# Patient Record
Sex: Male | Born: 1990
Health system: Southern US, Community
[De-identification: ages and names within clinical notes are randomized; demographics above are authoritative.]

## PROBLEM LIST (undated history)

## (undated) DIAGNOSIS — J40 Bronchitis, not specified as acute or chronic: Secondary | ICD-10-CM

## (undated) HISTORY — PX: OTHER SURGICAL HISTORY: SHX169

---
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
## 2011-04-22 LAB — DIFFERENTIAL
Basophils Absolute: 0
Eosinophils Absolute: 0.1
Lymphocytes Relative: 26
Lymphs Abs: 2.8
Neutro Abs: 6.7

## 2011-04-22 LAB — URINALYSIS, ROUTINE W REFLEX MICROSCOPIC
Bilirubin Urine: NEGATIVE
Hgb urine dipstick: NEGATIVE
Protein, ur: NEGATIVE
Urobilinogen, UA: 0.2

## 2011-04-22 LAB — POCT I-STAT, CHEM 8
Calcium, Ion: 1.17
Glucose, Bld: 75
HCT: 46
Hemoglobin: 15.6
TCO2: 28

## 2011-04-22 LAB — CBC
Hemoglobin: 14.2
MCHC: 32.8
Platelets: ADEQUATE
RDW: 14.8

## 2011-04-22 LAB — RAPID URINE DRUG SCREEN, HOSP PERFORMED
Amphetamines: NOT DETECTED
Barbiturates: NOT DETECTED
Benzodiazepines: NOT DETECTED
Opiates: NOT DETECTED

## 2011-10-13 ENCOUNTER — Telehealth (HOSPITAL_COMMUNITY): Payer: Self-pay | Admitting: *Deleted

## 2011-10-13 NOTE — ED Notes (Signed)
1300 Vicki from the [facility] called on VM at the clinical desk. Charlotte Sanes took the information off the phone and gave it to me. I could not find any recent visits for pt. Last visit in 11/ 15/2010. I did not know if she needs lab results from that visit.  I called Chip Boer back but they could not locate her. I left a message for her to call me back.Vassie Moselle 10/13/2011

## 2011-10-13 NOTE — ED Notes (Signed)
Fax received from Midge Aver at the [facility] requesting pt.'s chart and labs from most recent visit with a medical release form signed by the pt.  I printed both and faxed them to her @ [phone] and confirmation received. I called her and left a message on her VM that I had faxed the information she requested and to call me if any further questions. Vassie Moselle 10/13/2011

## 2011-10-27 ENCOUNTER — Encounter (HOSPITAL_COMMUNITY): Payer: Self-pay | Admitting: *Deleted

## 2011-10-27 ENCOUNTER — Emergency Department (HOSPITAL_COMMUNITY)
Admission: EM | Admit: 2011-10-27 | Discharge: 2011-10-27 | Disposition: A | Discharge status: Home / Self Care | Payer: Self-pay | Attending: Emergency Medicine | Admitting: Emergency Medicine

## 2011-10-27 DIAGNOSIS — K029 Dental caries, unspecified: Secondary | ICD-10-CM | POA: Insufficient documentation

## 2011-10-27 DIAGNOSIS — K0889 Other specified disorders of teeth and supporting structures: Secondary | ICD-10-CM

## 2011-10-27 DIAGNOSIS — J45909 Unspecified asthma, uncomplicated: Secondary | ICD-10-CM | POA: Insufficient documentation

## 2011-10-27 DIAGNOSIS — F17200 Nicotine dependence, unspecified, uncomplicated: Secondary | ICD-10-CM | POA: Insufficient documentation

## 2011-10-27 MED ORDER — HYDROCODONE-ACETAMINOPHEN 5-325 MG PO TABS: 1.0000 | ORAL_TABLET | Freq: Four times a day (QID) | ORAL | Status: AC | PRN

## 2011-10-27 MED ORDER — PENICILLIN V POTASSIUM 500 MG PO TABS: 500.0000 mg | ORAL_TABLET | Freq: Four times a day (QID) | ORAL | Status: AC

## 2011-10-27 MED ORDER — IBUPROFEN 800 MG PO TABS: 800.0000 mg | ORAL_TABLET | Freq: Three times a day (TID) | ORAL | Status: AC | PRN

## 2011-10-27 NOTE — Discharge Instructions (Signed)
Return here as needed. Follow up with the dentist provided. °

## 2011-10-27 NOTE — ED Notes (Signed)
Patient complains of pain in the top right side for 2 mths or more

## 2011-10-27 NOTE — ED Provider Notes (Signed)
History     CSN: [id number]  Arrival date & time 10/27/11  0919   First MD Initiated Contact with Patient 10/27/11 [phone]      Chief Complaint  Patient presents with  . Dental Pain    HPI Patient presents to the emergency department with right upper third molar pain.  He states that 2 months ago the tooth cracked and broken off.  He denies fever, nausea/vomiting, difficulty breathing or swallowing.  He also denies swelling under his tongue or neck. Past Medical History  Diagnosis Date  . Asthma     History reviewed. No pertinent past surgical history.  No family history on file.  History  Substance Use Topics  . Smoking status: Current Everyday Smoker  . Smokeless tobacco: Not on file  . Alcohol Use: Yes      Review of Systems All pertinent positives and negatives reviewed in the history of present illness  Allergies  Review of patient's allergies indicates no known allergies.  Home Medications   Current Outpatient Rx  Name Route Sig Dispense Refill  . ACETAMINOPHEN 500 MG PO TABS Oral Take 1,000 mg by mouth every 6 (six) hours as needed. For pain      BP 114/62  Pulse 62  Temp(Src) 98.5 F (36.9 C) (Oral)  Resp 16  Ht 5\' 7"  (1.702 m)  Wt 155 lb (70.308 kg)  BMI 24.28 kg/m2  SpO2 100%  Physical Exam  Constitutional: He appears well-developed and well-nourished. No distress.  HENT:  Head: Normocephalic and atraumatic. No trismus in the jaw.  Mouth/Throat: Uvula is midline, oropharynx is clear and moist and mucous membranes are normal. Abnormal dentition. Dental caries present. No uvula swelling.    Eyes: Pupils are equal, round, and reactive to light.  Cardiovascular: Normal rate, regular rhythm and normal heart sounds.  Exam reveals no gallop and no friction rub.   No murmur heard. Pulmonary/Chest: Effort normal and breath sounds normal. No respiratory distress. He has no wheezes.    ED Course  Procedures (including critical care time)  Patient  will be referred to oral surgery for further care of this broken tooth.  He is to return here as needed.  No signs of abscess at this time.   MDM          Carlyle Dolly, PA-C 10/27/11 1046

## 2011-10-27 NOTE — ED Notes (Signed)
Pt presents to department for evaluation of R upper molar pain. Ongoing for several months, no relief from OTC medications. 7/10 constant pain at the time. No facial swelling noted. Unable to see dentist at the time. He is alert and oriented x4. No signs of distress noted.

## 2011-10-29 NOTE — ED Provider Notes (Signed)
Medical screening examination/treatment/procedure(s) were performed by non-physician practitioner and as supervising physician I was immediately available for consultation/collaboration.   Suzi Roots, MD 10/29/11 [phone]

## 2012-05-09 ENCOUNTER — Emergency Department (HOSPITAL_COMMUNITY)
Admission: EM | Admit: 2012-05-09 | Discharge: 2012-05-09 | Disposition: A | Discharge status: Home / Self Care | Payer: No Typology Code available for payment source | Attending: Emergency Medicine | Admitting: Emergency Medicine

## 2012-05-09 ENCOUNTER — Emergency Department (HOSPITAL_COMMUNITY): Payer: No Typology Code available for payment source

## 2012-05-09 ENCOUNTER — Encounter (HOSPITAL_COMMUNITY): Payer: Self-pay | Admitting: Emergency Medicine

## 2012-05-09 DIAGNOSIS — F17200 Nicotine dependence, unspecified, uncomplicated: Secondary | ICD-10-CM | POA: Insufficient documentation

## 2012-05-09 DIAGNOSIS — IMO0002 Reserved for concepts with insufficient information to code with codable children: Secondary | ICD-10-CM | POA: Insufficient documentation

## 2012-05-09 DIAGNOSIS — Y9389 Activity, other specified: Secondary | ICD-10-CM | POA: Insufficient documentation

## 2012-05-09 DIAGNOSIS — J45909 Unspecified asthma, uncomplicated: Secondary | ICD-10-CM | POA: Insufficient documentation

## 2012-05-09 DIAGNOSIS — S7000XA Contusion of unspecified hip, initial encounter: Secondary | ICD-10-CM | POA: Insufficient documentation

## 2012-05-09 DIAGNOSIS — T148XXA Other injury of unspecified body region, initial encounter: Secondary | ICD-10-CM

## 2012-05-09 MED ORDER — OXYCODONE-ACETAMINOPHEN 5-325 MG PO TABS
2.0000 | ORAL_TABLET | Freq: Once | ORAL | Status: AC
  Administered 2012-05-09: 2 via ORAL
  Filled 2012-05-09: qty 2

## 2012-05-09 MED ORDER — CYCLOBENZAPRINE HCL 10 MG PO TABS: 10.0000 mg | ORAL_TABLET | Freq: Three times a day (TID) | ORAL | Status: DC | PRN

## 2012-05-09 MED ORDER — HYDROCODONE-ACETAMINOPHEN 5-325 MG PO TABS: 1.0000 | ORAL_TABLET | ORAL | Status: DC | PRN
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
## 2012-05-09 NOTE — ED Notes (Signed)
Patient transported to X-ray 
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
## 2012-05-09 NOTE — ED Provider Notes (Signed)
Medical screening examination/treatment/procedure(s) were performed by non-physician practitioner and as supervising physician I was immediately available for consultation/collaboration.  Olivia Mackie, MD 05/09/12 [phone]
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
## 2012-05-30 ENCOUNTER — Observation Stay (HOSPITAL_COMMUNITY)
Admission: EM | Admit: 2012-05-30 | Discharge: 2012-05-31 | Discharge status: Against Medical Advice | Payer: Self-pay | Attending: Emergency Medicine | Admitting: Emergency Medicine

## 2012-05-30 ENCOUNTER — Encounter (HOSPITAL_COMMUNITY): Payer: Self-pay | Admitting: *Deleted

## 2012-05-30 DIAGNOSIS — R55 Syncope and collapse: Principal | ICD-10-CM | POA: Insufficient documentation

## 2012-05-30 DIAGNOSIS — R011 Cardiac murmur, unspecified: Secondary | ICD-10-CM | POA: Insufficient documentation

## 2012-05-30 DIAGNOSIS — R0789 Other chest pain: Secondary | ICD-10-CM | POA: Insufficient documentation

## 2012-05-30 DIAGNOSIS — I10 Essential (primary) hypertension: Secondary | ICD-10-CM | POA: Insufficient documentation

## 2012-05-30 DIAGNOSIS — I959 Hypotension, unspecified: Secondary | ICD-10-CM | POA: Insufficient documentation

## 2012-05-30 HISTORY — DX: Bronchitis, not specified as acute or chronic: J40

## 2012-05-30 LAB — CBC WITH DIFFERENTIAL/PLATELET
Basophils Relative: 0 % (ref 0–1)
Eosinophils Absolute: 0.1 10*3/uL (ref 0.0–0.7)
Hemoglobin: 12.9 g/dL — ABNORMAL LOW (ref 13.0–17.0)
MCH: 30.4 pg (ref 26.0–34.0)
MCHC: 34.5 g/dL (ref 30.0–36.0)
Monocytes Relative: 18 % — ABNORMAL HIGH (ref 3–12)
Neutrophils Relative %: 71 % (ref 43–77)
Platelets: 195 10*3/uL (ref 150–400)

## 2012-05-30 LAB — RAPID URINE DRUG SCREEN, HOSP PERFORMED: Benzodiazepines: NOT DETECTED

## 2012-05-30 LAB — BASIC METABOLIC PANEL
BUN: 9 mg/dL (ref 6–23)
Calcium: 8.2 mg/dL — ABNORMAL LOW (ref 8.4–10.5)
Creatinine, Ser: 1.03 mg/dL (ref 0.50–1.35)
GFR calc Af Amer: >90 mL/min (ref >90)
GFR calc non Af Amer: >90 mL/min (ref >90)
Potassium: 3.6 mEq/L (ref 3.5–5.1)

## 2012-05-30 LAB — URINALYSIS, ROUTINE W REFLEX MICROSCOPIC
Bilirubin Urine: NEGATIVE
Nitrite: NEGATIVE
Urobilinogen, UA: 0.2 mg/dL (ref 0.0–1.0)

## 2012-05-30 MED ORDER — SODIUM CHLORIDE 0.9 % IV BOLUS (SEPSIS): 1000.0000 mL | Freq: Once | INTRAVENOUS | Status: DC

## 2012-05-30 MED ORDER — SODIUM CHLORIDE 0.9 % IV BOLUS (SEPSIS)
1000.0000 mL | Freq: Once | INTRAVENOUS | Status: AC
  Administered 2012-05-30: 1000 mL via INTRAVENOUS

## 2012-05-30 NOTE — ED Notes (Signed)
EMS called to home.  Found patient laying on couch BP was 92 systolic on EMS arrival. Patient complaining of dizziness and lightheadedness. Denies pain and nausea.
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
## 2012-05-30 NOTE — ED Notes (Signed)
Pt is aware of the need for urine sample. There is a urinal at bedside.
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
## 2012-05-31 NOTE — ED Notes (Signed)
Patient refusing lab work. RN made aware.

## 2012-05-31 NOTE — ED Notes (Signed)
Pt out in hallway requesting IV to be removed; states "I am not staying"; MD aware.

## 2012-05-31 NOTE — ED Notes (Signed)
Pt requesting to leave, does not want to stay to be admitted; [facility] in to see pt to discuss admission.

## 2012-05-31 NOTE — ED Notes (Signed)
RN in to remove IV and talk to pt about AMA; RN advised pt that if he left it would be AMA, RN advised that his BP has been low and they are unsure why and that if he left and his BP continued to drop he could suffer from numerous things including heart attack, stroke, cardiac arrest and death. PT verbalizes understanding and states " I am still leaving". [facility] notified. Upon returning to room pt had left, refusing to sign his AMA paperwork.

## 2012-08-24 ENCOUNTER — Emergency Department (HOSPITAL_COMMUNITY)
Admission: EM | Admit: 2012-08-24 | Discharge: 2012-08-24 | Disposition: A | Discharge status: Home / Self Care | Payer: Self-pay | Attending: Emergency Medicine | Admitting: Emergency Medicine

## 2012-08-24 ENCOUNTER — Encounter (HOSPITAL_COMMUNITY): Payer: Self-pay | Admitting: Emergency Medicine

## 2012-08-24 DIAGNOSIS — F17200 Nicotine dependence, unspecified, uncomplicated: Secondary | ICD-10-CM | POA: Insufficient documentation

## 2012-08-24 DIAGNOSIS — J45909 Unspecified asthma, uncomplicated: Secondary | ICD-10-CM | POA: Insufficient documentation

## 2012-08-24 DIAGNOSIS — Z8709 Personal history of other diseases of the respiratory system: Secondary | ICD-10-CM | POA: Insufficient documentation

## 2012-08-24 DIAGNOSIS — L299 Pruritus, unspecified: Secondary | ICD-10-CM | POA: Insufficient documentation

## 2012-08-24 DIAGNOSIS — Z79899 Other long term (current) drug therapy: Secondary | ICD-10-CM | POA: Insufficient documentation

## 2012-08-24 DIAGNOSIS — B86 Scabies: Secondary | ICD-10-CM | POA: Insufficient documentation

## 2012-08-24 MED ORDER — PERMETHRIN 5 % EX CREA: TOPICAL_CREAM | CUTANEOUS | Status: DC

## 2012-08-24 MED ORDER — HYDROXYZINE HCL 10 MG PO TABS: 10.0000 mg | ORAL_TABLET | Freq: Four times a day (QID) | ORAL | Status: DC | PRN
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
## 2012-08-24 NOTE — ED Notes (Signed)
Pt reports itching at night X2-3 weeks, no other complaints, NAD

## 2012-08-24 NOTE — ED Provider Notes (Signed)
Medical screening examination/treatment/procedure(s) were performed by non-physician practitioner and as supervising physician I was immediately available for consultation/collaboration.   Carleene Cooper III, MD 08/24/12 2014

## 2012-11-27 ENCOUNTER — Encounter (HOSPITAL_COMMUNITY): Payer: Self-pay | Admitting: *Deleted

## 2012-11-27 ENCOUNTER — Emergency Department (HOSPITAL_COMMUNITY): Payer: Self-pay

## 2012-11-27 ENCOUNTER — Emergency Department (HOSPITAL_COMMUNITY)
Admission: EM | Admit: 2012-11-27 | Discharge: 2012-11-27 | Discharge status: Against Medical Advice | Payer: Self-pay | Attending: Emergency Medicine | Admitting: Emergency Medicine

## 2012-11-27 DIAGNOSIS — R0602 Shortness of breath: Secondary | ICD-10-CM | POA: Insufficient documentation

## 2012-11-27 DIAGNOSIS — Z79899 Other long term (current) drug therapy: Secondary | ICD-10-CM | POA: Insufficient documentation

## 2012-11-27 DIAGNOSIS — F17200 Nicotine dependence, unspecified, uncomplicated: Secondary | ICD-10-CM | POA: Insufficient documentation

## 2012-11-27 DIAGNOSIS — J45909 Unspecified asthma, uncomplicated: Secondary | ICD-10-CM | POA: Insufficient documentation

## 2012-11-27 DIAGNOSIS — Z8709 Personal history of other diseases of the respiratory system: Secondary | ICD-10-CM | POA: Insufficient documentation

## 2012-11-27 DIAGNOSIS — R0789 Other chest pain: Secondary | ICD-10-CM | POA: Insufficient documentation

## 2012-11-27 DIAGNOSIS — R079 Chest pain, unspecified: Secondary | ICD-10-CM

## 2012-11-27 LAB — BASIC METABOLIC PANEL
BUN: 9 mg/dL (ref 6–23)
GFR calc Af Amer: >90 mL/min (ref >90)
GFR calc non Af Amer: >90 mL/min (ref >90)
Potassium: 3.7 mEq/L (ref 3.5–5.1)
Sodium: 139 mEq/L (ref 135–145)

## 2012-11-27 LAB — CBC
MCHC: 35.5 g/dL (ref 30.0–36.0)
Platelets: 243 10*3/uL (ref 150–400)
RDW: 13.2 % (ref 11.5–15.5)

## 2012-11-27 MED ORDER — PREDNISONE 20 MG PO TABS
40.0000 mg | ORAL_TABLET | Freq: Once | ORAL | Status: DC
  Filled 2012-11-27: qty 2
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
## 2012-11-27 NOTE — ED Notes (Signed)
Patient transported to X-ray 

## 2012-11-27 NOTE — ED Notes (Signed)
Pt is here with chest pain since December that increases with exercise.  History of asthma and states inhaler does not help symptoms

## 2012-11-27 NOTE — ED Provider Notes (Signed)
Medical screening examination/treatment/procedure(s) were performed by non-physician practitioner and as supervising physician I was immediately available for consultation/collaboration. Jamilet Ambroise, MD, FACEP   Juanluis Guastella L Tex Conroy, MD 11/27/12 1631 

## 2012-11-27 NOTE — ED Notes (Signed)
In to give patient medication , patient not in the room. Gown on bed, leads removed. No belongings left in room. Patient cannot be found in ER. PA made aware. Disposition will be AMA.

## 2014-01-15 ENCOUNTER — Encounter (HOSPITAL_COMMUNITY)
Admission: EM | Disposition: A | Discharge status: Home / Self Care | Payer: Self-pay | Source: Home / Self Care | Attending: Emergency Medicine

## 2014-01-15 ENCOUNTER — Encounter (HOSPITAL_COMMUNITY): Payer: Self-pay | Admitting: Anesthesiology

## 2014-01-15 ENCOUNTER — Encounter (HOSPITAL_COMMUNITY): Payer: Self-pay | Admitting: Emergency Medicine

## 2014-01-15 ENCOUNTER — Ambulatory Visit (HOSPITAL_COMMUNITY)
Admission: RE | Admit: 2014-01-15 | Discharge: 2014-01-15 | Disposition: A | Discharge status: Home / Self Care | Payer: Self-pay | Source: Ambulatory Visit | Attending: Orthopedic Surgery | Admitting: Orthopedic Surgery

## 2014-01-15 ENCOUNTER — Ambulatory Visit (HOSPITAL_COMMUNITY): Payer: Self-pay | Admitting: Anesthesiology

## 2014-01-15 ENCOUNTER — Emergency Department (HOSPITAL_COMMUNITY)
Admission: EM | Admit: 2014-01-15 | Discharge: 2014-01-15 | Disposition: A | Discharge status: Home / Self Care | Payer: Self-pay | Attending: Emergency Medicine | Admitting: Emergency Medicine

## 2014-01-15 ENCOUNTER — Encounter (HOSPITAL_COMMUNITY)
Admission: RE | Disposition: A | Discharge status: Home / Self Care | Payer: Self-pay | Source: Ambulatory Visit | Attending: Orthopedic Surgery

## 2014-01-15 ENCOUNTER — Emergency Department (HOSPITAL_COMMUNITY): Payer: Self-pay

## 2014-01-15 DIAGNOSIS — J45909 Unspecified asthma, uncomplicated: Secondary | ICD-10-CM | POA: Insufficient documentation

## 2014-01-15 DIAGNOSIS — S62318A Displaced fracture of base of other metacarpal bone, initial encounter for closed fracture: Secondary | ICD-10-CM

## 2014-01-15 DIAGNOSIS — IMO0001 Reserved for inherently not codable concepts without codable children: Secondary | ICD-10-CM

## 2014-01-15 DIAGNOSIS — IMO0002 Reserved for concepts with insufficient information to code with codable children: Secondary | ICD-10-CM | POA: Insufficient documentation

## 2014-01-15 DIAGNOSIS — S63051A Subluxation of other carpometacarpal joint of right hand, initial encounter: Secondary | ICD-10-CM

## 2014-01-15 DIAGNOSIS — F17200 Nicotine dependence, unspecified, uncomplicated: Secondary | ICD-10-CM | POA: Insufficient documentation

## 2014-01-15 DIAGNOSIS — S63056A Dislocation of other carpometacarpal joint of unspecified hand, initial encounter: Secondary | ICD-10-CM | POA: Insufficient documentation

## 2014-01-15 DIAGNOSIS — Y9389 Activity, other specified: Secondary | ICD-10-CM | POA: Insufficient documentation

## 2014-01-15 DIAGNOSIS — S62319A Displaced fracture of base of unspecified metacarpal bone, initial encounter for closed fracture: Secondary | ICD-10-CM | POA: Insufficient documentation

## 2014-01-15 DIAGNOSIS — Y9289 Other specified places as the place of occurrence of the external cause: Secondary | ICD-10-CM | POA: Insufficient documentation

## 2014-01-15 DIAGNOSIS — Y33XXXA Other specified events, undetermined intent, initial encounter: Secondary | ICD-10-CM | POA: Insufficient documentation

## 2014-01-15 HISTORY — PX: PERCUTANEOUS PINNING: SHX2209

## 2014-01-15 LAB — CBC
HCT: 39.9 % (ref 39.0–52.0)
Hemoglobin: 13.7 g/dL (ref 13.0–17.0)
MCH: 31 pg (ref 26.0–34.0)
MCHC: 34.3 g/dL (ref 30.0–36.0)
MCV: 90.3 fL (ref 78.0–100.0)
PLATELETS: 235 10*3/uL (ref 150–400)
RBC: 4.42 MIL/uL (ref 4.22–5.81)
RDW: 13.4 % (ref 11.5–15.5)
WBC: 13.5 10*3/uL — ABNORMAL HIGH (ref 4.0–10.5)

## 2014-01-15 LAB — COMPREHENSIVE METABOLIC PANEL
ALBUMIN: 4.7 g/dL (ref 3.5–5.2)
ALK PHOS: 53 U/L (ref 39–117)
ALT: 24 U/L (ref 0–53)
AST: 32 U/L (ref 0–37)
BILIRUBIN TOTAL: 0.5 mg/dL (ref 0.3–1.2)
BUN: 5 mg/dL — ABNORMAL LOW (ref 6–23)
CHLORIDE: 99 meq/L (ref 96–112)
CO2: 24 mEq/L (ref 19–32)
Calcium: 9.2 mg/dL (ref 8.4–10.5)
Creatinine, Ser: 0.98 mg/dL (ref 0.50–1.35)
GFR calc non Af Amer: >90 mL/min (ref >90)
GLUCOSE: 81 mg/dL (ref 70–99)
POTASSIUM: 4.1 meq/L (ref 3.7–5.3)
Sodium: 138 mEq/L (ref 137–147)
Total Protein: 7.8 g/dL (ref 6.0–8.3)

## 2014-01-15 SURGERY — PINNING, EXTREMITY, PERCUTANEOUS
Anesthesia: General | Laterality: Right

## 2014-01-15 SURGERY — CLOSED REDUCTION, FRACTURE, METACARPAL BONE, WITH PERCUTANEOUS PINNING
Anesthesia: General | Laterality: Right

## 2014-01-15 MED ORDER — LACTATED RINGERS IV SOLN
Freq: Once | INTRAVENOUS | Status: AC
  Administered 2014-01-15: 16:00:00 via INTRAVENOUS

## 2014-01-15 MED ORDER — PROPOFOL 10 MG/ML IV BOLUS
INTRAVENOUS | Status: AC
  Filled 2014-01-15: qty 20

## 2014-01-15 MED ORDER — FENTANYL CITRATE 0.05 MG/ML IJ SOLN
INTRAMUSCULAR | Status: AC
  Filled 2014-01-15: qty 5

## 2014-01-15 MED ORDER — MIDAZOLAM HCL 2 MG/2ML IJ SOLN
INTRAMUSCULAR | Status: AC
  Filled 2014-01-15: qty 2

## 2014-01-15 MED ORDER — CEFAZOLIN SODIUM-DEXTROSE 2-3 GM-% IV SOLR
INTRAVENOUS | Status: AC
  Filled 2014-01-15: qty 50

## 2014-01-15 MED ORDER — CHLORHEXIDINE GLUCONATE 4 % EX LIQD: 60.0000 mL | Freq: Once | CUTANEOUS | Status: AC

## 2014-01-15 MED ORDER — LIDOCAINE HCL (CARDIAC) 20 MG/ML IV SOLN
INTRAVENOUS | Status: DC | PRN
  Administered 2014-01-15: 40 mg via INTRAVENOUS

## 2014-01-15 MED ORDER — ONDANSETRON HCL 4 MG/2ML IJ SOLN
INTRAMUSCULAR | Status: DC | PRN
  Administered 2014-01-15: 4 mg via INTRAVENOUS

## 2014-01-15 MED ORDER — OXYCODONE-ACETAMINOPHEN 5-325 MG PO TABS: ORAL_TABLET | ORAL | Status: DC

## 2014-01-15 MED ORDER — HYDROMORPHONE HCL PF 1 MG/ML IJ SOLN: 0.2500 mg | INTRAMUSCULAR | Status: DC | PRN

## 2014-01-15 MED ORDER — FENTANYL CITRATE 0.05 MG/ML IJ SOLN
INTRAMUSCULAR | Status: DC | PRN
  Administered 2014-01-15 (×3): 50 ug via INTRAVENOUS

## 2014-01-15 MED ORDER — BUPIVACAINE-EPINEPHRINE (PF) 0.5% -1:200000 IJ SOLN
INTRAMUSCULAR | Status: DC | PRN
  Administered 2014-01-15: 30 mL via PERINEURAL

## 2014-01-15 MED ORDER — CEFAZOLIN SODIUM-DEXTROSE 2-3 GM-% IV SOLR
2.0000 g | INTRAVENOUS | Status: AC
  Administered 2014-01-15: 2 g via INTRAVENOUS

## 2014-01-15 MED ORDER — PROMETHAZINE HCL 25 MG/ML IJ SOLN: 6.2500 mg | INTRAMUSCULAR | Status: DC | PRN

## 2014-01-15 MED ORDER — OXYCODONE HCL 5 MG/5ML PO SOLN: 5.0000 mg | Freq: Once | ORAL | Status: DC | PRN

## 2014-01-15 MED ORDER — PROPOFOL INFUSION 10 MG/ML OPTIME
INTRAVENOUS | Status: DC | PRN
  Administered 2014-01-15: 100 ug/kg/min via INTRAVENOUS

## 2014-01-15 MED ORDER — MIDAZOLAM HCL 5 MG/5ML IJ SOLN
INTRAMUSCULAR | Status: DC | PRN
  Administered 2014-01-15 (×2): 1 mg via INTRAVENOUS

## 2014-01-15 MED ORDER — OXYCODONE-ACETAMINOPHEN 5-325 MG PO TABS
2.0000 | ORAL_TABLET | Freq: Once | ORAL | Status: AC
  Administered 2014-01-15: 2 via ORAL
  Filled 2014-01-15: qty 2

## 2014-01-15 MED ORDER — HYDROMORPHONE HCL PF 1 MG/ML IJ SOLN
1.0000 mg | Freq: Once | INTRAMUSCULAR | Status: DC
  Filled 2014-01-15: qty 1

## 2014-01-15 MED ORDER — LACTATED RINGERS IV SOLN
INTRAVENOUS | Status: DC | PRN
  Administered 2014-01-15: 16:00:00 via INTRAVENOUS

## 2014-01-15 MED ORDER — OXYCODONE HCL 5 MG PO TABS: 5.0000 mg | ORAL_TABLET | Freq: Once | ORAL | Status: DC | PRN

## 2014-01-15 SURGICAL SUPPLY — 50 items
BANDAGE COBAN STERILE 2 (GAUZE/BANDAGES/DRESSINGS) IMPLANT
BANDAGE ELASTIC 3 VELCRO ST LF (GAUZE/BANDAGES/DRESSINGS) ×3 IMPLANT
BANDAGE ELASTIC 4 VELCRO ST LF (GAUZE/BANDAGES/DRESSINGS) ×3 IMPLANT
BANDAGE GAUZE ELAST BULKY 4 IN (GAUZE/BANDAGES/DRESSINGS) ×3 IMPLANT
BENZOIN TINCTURE PRP APPL 2/3 (GAUZE/BANDAGES/DRESSINGS) IMPLANT
BLADE SURG ROTATE 9660 (MISCELLANEOUS) ×3 IMPLANT
BNDG ESMARK 4X9 LF (GAUZE/BANDAGES/DRESSINGS) ×3 IMPLANT
CLOSURE WOUND 1/2 X4 (GAUZE/BANDAGES/DRESSINGS)
CORDS BIPOLAR (ELECTRODE) ×3 IMPLANT
COVER SURGICAL LIGHT HANDLE (MISCELLANEOUS) ×3 IMPLANT
CUFF TOURNIQUET SINGLE 18IN (TOURNIQUET CUFF) IMPLANT
CUFF TOURNIQUET SINGLE 24IN (TOURNIQUET CUFF) IMPLANT
DRAPE C-ARM MINI 42X72 WSTRAPS (DRAPES) ×3 IMPLANT
DRAPE OEC MINIVIEW 54X84 (DRAPES) ×3 IMPLANT
DRAPE SURG 17X23 STRL (DRAPES) ×3 IMPLANT
DRSG EMULSION OIL 3X3 NADH (GAUZE/BANDAGES/DRESSINGS) ×3 IMPLANT
DURAPREP 26ML APPLICATOR (WOUND CARE) ×3 IMPLANT
GAUZE XEROFORM 1X8 LF (GAUZE/BANDAGES/DRESSINGS) ×3 IMPLANT
GLOVE BIO SURGEON STRL SZ7.5 (GLOVE) ×3 IMPLANT
GLOVE BIOGEL PI IND STRL 8 (GLOVE) ×1 IMPLANT
GLOVE BIOGEL PI INDICATOR 8 (GLOVE) ×2
GOWN BRE IMP PREV XXLGXLNG (GOWN DISPOSABLE) ×3 IMPLANT
GOWN STRL REUS W/ TWL LRG LVL3 (GOWN DISPOSABLE) ×2 IMPLANT
GOWN STRL REUS W/TWL LRG LVL3 (GOWN DISPOSABLE) ×4
K-WIRE DBL 4X.28 (WIRE) ×9
KIT BASIN OR (CUSTOM PROCEDURE TRAY) ×3 IMPLANT
KIT ROOM TURNOVER OR (KITS) ×3 IMPLANT
KWIRE DBL 4X.28 (WIRE) ×3 IMPLANT
MANIFOLD NEPTUNE II (INSTRUMENTS) IMPLANT
NEEDLE HYPO 25GX1X1/2 BEV (NEEDLE) ×3 IMPLANT
NS IRRIG 1000ML POUR BTL (IV SOLUTION) IMPLANT
PACK ORTHO EXTREMITY (CUSTOM PROCEDURE TRAY) ×3 IMPLANT
PAD ARMBOARD 7.5X6 YLW CONV (MISCELLANEOUS) ×6 IMPLANT
PADDING CAST ABS 3INX4YD NS (CAST SUPPLIES) ×2
PADDING CAST ABS COTTON 3X4 (CAST SUPPLIES) ×1 IMPLANT
SPLINT PLASTER EXTRA FAST 3X15 (CAST SUPPLIES) ×2
SPLINT PLASTER GYPS XFAST 3X15 (CAST SUPPLIES) ×1 IMPLANT
SPONGE GAUZE 4X4 12PLY (GAUZE/BANDAGES/DRESSINGS) ×3 IMPLANT
SPONGE GAUZE 4X4 12PLY STER LF (GAUZE/BANDAGES/DRESSINGS) ×3 IMPLANT
STRIP CLOSURE SKIN 1/2X4 (GAUZE/BANDAGES/DRESSINGS) IMPLANT
SUCTION FRAZIER TIP 10 FR DISP (SUCTIONS) ×3 IMPLANT
SUT ETHILON 4 0 P 3 18 (SUTURE) IMPLANT
SUT PROLENE 4 0 P 3 18 (SUTURE) IMPLANT
SYR CONTROL 10ML LL (SYRINGE) ×3 IMPLANT
TOWEL OR 17X24 6PK STRL BLUE (TOWEL DISPOSABLE) ×3 IMPLANT
TOWEL OR 17X26 10 PK STRL BLUE (TOWEL DISPOSABLE) ×3 IMPLANT
TUBE CONNECTING 12'X1/4 (SUCTIONS) ×1
TUBE CONNECTING 12X1/4 (SUCTIONS) ×2 IMPLANT
TUBE FEEDING 5FR 15 INCH (TUBING) IMPLANT
WATER STERILE IRR 1000ML POUR (IV SOLUTION) IMPLANT

## 2014-01-15 NOTE — Op Note (Signed)
[id number]

## 2014-01-15 NOTE — Progress Notes (Signed)
Orthopedic Tech Progress Note Patient Details:  Paul Ochoa [name]1/1992 [id number]  Ortho Devices Type of Ortho Device: Wrist splint Ortho Device/Splint Location: quick fit wrist splint applied the right upper extremity. Ortho Device/Splint Interventions: Application   Early CharsBaker,[name] 01/15/2014, 1:36 PM

## 2014-01-15 NOTE — Progress Notes (Signed)
Pt with history of asthma, pt asymptomatic and hasn't used inhaler in months.  Dr. Randa EvensEdwards anesthesiologist made aware, stated CXR not needed.

## 2014-01-15 NOTE — ED Notes (Signed)
Hand surgery at bedside to speak with patient.

## 2014-01-15 NOTE — Anesthesia Preprocedure Evaluation (Addendum)
Anesthesia Evaluation  Patient identified by MRN, date of birth, ID band Patient awake    Reviewed: Allergy & Precautions, H&P , NPO status , Patient's Chart, lab work & pertinent test results  History of Anesthesia Complications Negative for: history of anesthetic complications  Airway Mallampati: I TM Distance: >3 FB Neck ROM: full    Dental  (+) Teeth Intact, Dental Advidsory Given   Pulmonary asthma , Current Smoker,  breath sounds clear to auscultation        Cardiovascular negative cardio ROS  Rhythm:regular Rate:Normal     Neuro/Psych negative neurological ROS  negative psych ROS   GI/Hepatic negative GI ROS, Neg liver ROS,   Endo/Other  negative endocrine ROS  Renal/GU negative Renal ROS     Musculoskeletal   Abdominal   Peds  Hematology   Anesthesia Other Findings   Reproductive/Obstetrics negative OB ROS                          Anesthesia Physical Anesthesia Plan  ASA: II  Anesthesia Plan: MAC   Post-op Pain Management:    Induction:   Airway Management Planned:   Additional Equipment:   Intra-op Plan:   Post-operative Plan:   Informed Consent: I have reviewed the patients History and Physical, chart, labs and discussed the procedure including the risks, benefits and alternatives for the proposed anesthesia with the patient or authorized representative who has indicated his/her understanding and acceptance.   Dental Advisory Given  Plan Discussed with: Anesthesiologist, CRNA and Surgeon  Anesthesia Plan Comments:        Anesthesia Quick Evaluation

## 2014-01-15 NOTE — Op Note (Signed)
Intra-operative fluoroscopic images in the AP, lateral, and oblique views were taken and evaluated by myself.  Reduction and hardware placement were confirmed.  There was no intraarticular penetration of permanent hardware.  

## 2014-01-15 NOTE — ED Notes (Signed)
Patient states hit a wall with R hand.  Patient complaining of R hand and R wrist pain.

## 2014-01-15 NOTE — Transfer of Care (Signed)
Immediate Anesthesia Transfer of Care Note  Patient: Paul Ochoa  Procedure(s) Performed: Procedure(s): CLOSED REDUCTION WITH PERCUTANEOUS PINNING (Right)  Patient Location: PACU  Anesthesia Type:MAC  Level of Consciousness: awake, alert  and oriented  Airway & Oxygen Therapy: Patient Spontanous Breathing  Post-op Assessment: Report given to PACU RN and Post -op Vital signs reviewed and stable  Post vital signs: Reviewed and stable  Complications: No apparent anesthesia complications

## 2014-01-15 NOTE — Progress Notes (Signed)
Orthopedic Tech Progress Note Patient Details:  Paul Ochoa 04/17/1991 [id number]  Ortho Devices Type of Ortho Device: Arm sling Ortho Device/Splint Location: rue Ortho Device/Splint Interventions: Application   [name], R[place] 01/15/2014, 7:10 PM

## 2014-01-15 NOTE — ED Notes (Signed)
Patient refused dilaudid stating "I'm scared of needles".

## 2014-01-15 NOTE — Brief Op Note (Signed)
01/15/2014  5:50 PM  PATIENT:  Rudean CurtLandis K Zellars  23 y.o. male  PRE-OPERATIVE DIAGNOSIS:  Right Hand Fracture  POST-OPERATIVE DIAGNOSIS:  Right hand long/ring/small carpometacarpal dislocations  PROCEDURE:  Procedure(s): CLOSED REDUCTION WITH PERCUTANEOUS PINNING (Right)  SURGEON:  Surgeon(s) and Role:    * Tami RibasKevin R Kuzma, MD - Primary  PHYSICIAN ASSISTANT:   ASSISTANTS: none   ANESTHESIA:   regional and MAC  EBL:     BLOOD ADMINISTERED:none  DRAINS: none   LOCAL MEDICATIONS USED:  NONE  SPECIMEN:  No Specimen  DISPOSITION OF SPECIMEN:  N/A  COUNTS:  YES  TOURNIQUET:    DICTATION: .Other Dictation: Dictation Number [id number]  PLAN OF CARE: Discharge to home after PACU  PATIENT DISPOSITION:  PACU - hemodynamically stable.

## 2014-01-15 NOTE — Progress Notes (Signed)
Called patient. Patient stated he was waiting on his ride and the he would be here.

## 2014-01-15 NOTE — ED Notes (Signed)
MD at bedside. Hand surgery consult.

## 2014-01-15 NOTE — Anesthesia Procedure Notes (Addendum)
Anesthesia Regional Block:  Supraclavicular block  Pre-Anesthetic Checklist: ,, timeout performed, Correct Patient, Correct Site, Correct Laterality, Correct Procedure, Correct Position, site marked, Risks and benefits discussed,  Surgical consent,  Pre-op evaluation,  At surgeon's request and post-op pain management  Laterality: Right  Prep: chloraprep       Needles:  Injection technique: Single-shot  Needle Type: Echogenic Stimulator Needle     Needle Length: 5cm 5 cm Needle Gauge: 22 and 22 G    Additional Needles:  Procedures: ultrasound guided (picture in chart) and nerve stimulator Supraclavicular block  Nerve Stimulator or Paresthesia:  Response: bicep contraction, 0.45 mA,   Additional Responses:   Narrative:  Start time: 01/15/2014 4:19 PM End time: 01/15/2014 4:18 PM Injection made incrementally with aspirations every 5 mL.  Performed by: Personally  Anesthesiologist: J. Adonis Hugueninan Singer, MD  Additional Notes: Functioning IV was confirmed and monitors applied.  A 50mm 22ga echogenic arrow stimulator was used. Sterile prep and drape,hand hygiene and sterile gloves were used.Ultrasound guidance: relevant anatomy identified, needle position confirmed, local anesthetic spread visualized around nerve(s)., vascular puncture avoided.  Image printed for medical record.  Negative aspiration and negative test dose prior to incremental administration of local anesthetic. The patient tolerated the procedure well.   Procedure Name: MAC Date/Time: 01/15/2014 5:20 PM Performed by: Leonel Ramsay'LAUGHLIN, KAREN H Pre-anesthesia Checklist: Patient identified, Patient being monitored, Emergency Drugs available, Timeout performed and Suction available Patient Re-evaluated:Patient Re-evaluated prior to inductionOxygen Delivery Method: Nasal cannula Preoxygenation: Pre-oxygenation with 100% oxygen Dental Injury: Teeth and Oropharynx as per pre-operative assessment

## 2014-01-15 NOTE — Progress Notes (Signed)
Orthopedic Tech Progress Note Patient Details:  Paul Ochoa 12/16/1990 [id number]  Patient ID: Paul Ochoa, male   DOB: 06/16/1991, 23 y.o.   MRN: [id number] Viewed order from rn order list  Paul Ochoa, Paul Ochoa 01/15/2014, 8:24 PM

## 2014-01-15 NOTE — Discharge Instructions (Signed)

## 2014-01-15 NOTE — ED Notes (Signed)
Patient had asked if he could go home and come back for surgery.   Per Dr. Merlyn LotKuzma, ok for patient to leave after he has splint placed.

## 2014-01-15 NOTE — Discharge Instructions (Signed)
Boxer's Fracture °You have a break (fracture) of the fifth metacarpal bone. This is commonly called a boxer's fracture. This is the bone in the hand where the little finger attaches. The fracture is in the end of that bone, closest to the little finger. It is usually caused when you hit an object with a clenched fist. Often, the knuckle is pushed down by the impact. Sometimes, the fracture rotates out of position. A boxer's fracture will usually heal within 6 weeks, if it is treated properly and protected from re-injury. Surgery is sometimes needed. °A cast, splint, or bulky hand dressing may be used to protect and immobilize a boxer's fracture. Do not remove this device or dressing until your caregiver approves. Keep your hand elevated, and apply ice packs for 15-20 minutes every 2 hours, for the first 2 days. Elevation and ice help reduce swelling and relieve pain. See your caregiver, or an orthopedic specialist, for follow-up care within the next 10 days. This is to make sure your fracture is healing properly. °Document Released: 07/06/2005 Document Revised: 09/28/2011 Document Reviewed: 12/24/2006 °ExitCare® Patient Information ©2015 [employer]. This information is not intended to replace advice given to you by your health care provider. Make sure you discuss any questions you have with your health care provider. ° °

## 2014-01-15 NOTE — Anesthesia Postprocedure Evaluation (Signed)
  Anesthesia Post-op Note  Patient: Paul Ochoa  Procedure(s) Performed: Procedure(s): CLOSED REDUCTION WITH PERCUTANEOUS PINNING (Right)  Patient Location: PACU  Anesthesia Type:General  Level of Consciousness: awake  Airway and Oxygen Therapy: Patient Spontanous Breathing  Post-op Pain: mild  Post-op Assessment: Post-op Vital signs reviewed  Post-op Vital Signs: Reviewed  Last Vitals:  Filed Vitals:   01/15/14 1759  BP: 115/49  Pulse: 65  Temp: 36.8 C  Resp: 16    Complications: No apparent anesthesia complications

## 2014-01-15 NOTE — ED Notes (Signed)
Per Dr. Merlyn LotKuzma, patient to be NPO to go to surgery.
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
## 2014-01-16 ENCOUNTER — Encounter (HOSPITAL_COMMUNITY): Payer: Self-pay | Admitting: Orthopedic Surgery

## 2014-01-16 NOTE — Op Note (Signed)
NAMDelia Heady:  Muma, Kirtis             ACCOUNT NO.:  [id number]  MEDICAL RECORD NO.:  [id number]  LOCATION:  [facility]                         FACILITY:  [facility]  PHYSICIAN:  Betha LoaKevin Kuzma, MD        DATE OF BIRTH:  [birth year]  DATE OF PROCEDURE:  01/15/2014 DATE OF DISCHARGE:  01/15/2014                              OPERATIVE REPORT   PREOPERATIVE DIAGNOSIS:  Right hand long, ring, and small finger carpometacarpal dislocations.  POSTOPERATIVE DIAGNOSIS:  Right hand long, ring, and small finger carpometacarpal dislocations.  PROCEDURE:   1. Right hand closed reduction and percutaneous pinning of long finger carpometacarpal dislocation  2. Right hand closed reduction and percutaneous pinning of ring finger carpometacarpal dislocation 3. Right hand closed reduction and percutaneous pinning of small finger carpometacarpal dislocation  SURGEON:  Betha LoaKevin Kuzma, MD  ASSISTANT:  None.  ANESTHESIA:  Regional with MAC.  IV FLUIDS:  Per anesthesia flow sheet.  ESTIMATED BLOOD LOSS:  Minimal.  COMPLICATIONS:  None.  SPECIMENS:  None.  TOURNIQUET TIME:  None.  DISPOSITION:  Stable to PACU.  INDICATIONS:  Mr. Okey DupreCrawford is a 23 year old right-hand-dominant male who states he punched a wall this morning, injuring his right hand.  [facility] where radiographs were taken revealing dislocations of the [facility] of the long, ring, and small finger metacarpals.  I was consulted for management of injury.  On examination, he had intact sensation and capillary refill in the fingertips.  He could flex and extend the joint thumbs across his fingers.  He could move his fingers without significant pain.  I recommended going to the operating room for closed reduction and percutaneous pinning of the dislocations.  Risks, benefits, alternatives of the surgery were discussed including risk of blood loss, infection; damage to nerves, vessels, tendons, ligaments, bone;  failure of surgery; need for additional surgery, complications with wound healing, continued pain, instability, and stiffness.  He voiced understanding of these risks and elected to proceed.  OPERATIVE COURSE:  After being identified preoperatively by myself, the patient and I agreed upon procedure and site of procedure.  Surgical site was marked.  The risks, benefits, and alternatives of surgery were reviewed and wished to proceed.  Surgical consent had been signed.  He was given IV Ancef as preoperative antibiotic prophylaxis.  He was transferred to the operating room and placed on operating table in supine position with the right upper extremity on arm board.  Regional block had been performed by anesthesia in preoperative holding.  MAC sedation was given in the operating room.  Right upper extremity was prepped and draped in normal sterile orthopedic fashion.  Surgical pause was performed between surgeons, anesthesia, operating staff, and all were in agreement as to the patient, procedure, and site of procedure. The tourniquet at the proximal aspect of the extremity was never inflated.  Closed reduction of the CMC dislocations was performed successfully.  C-arm was used in AP, lateral, and oblique projections to ensure appropriate reduction which was the case.  Three 0.045-inch K- wires were then advanced across the metacarpal bases and into the carpus.  This was adequate to stabilize the [facility] dislocations.  C-arm  was used in AP, lateral, and oblique projections to ensure appropriate reduction and position of hardware which was the case.  The pins were bent and cut short.  The pin sites were dressed with sterile Xeroform, 4x4s, and wrapped with a Kerlix bandage.  A volar and dorsal slab splint including the long, ring, and small fingers were placed with the MPs flexed and IPs extended.  This was wrapped with Kerlix and Ace bandage. The fingertips were all pink with brisk capillary  refill after completion of the procedure.  Operative drapes were broken down and the patient was awoken from anesthesia safely.  He was transferred back to stretcher and taken to PACU in stable condition.  I will see him back in the office in 1 week for postoperative followup.  He was given Percocet 5/325, 1-2 p.o. q.6 hours p.r.n. pain, dispensed #40.     Betha LoaKevin Kuzma, MD     KK/MEDQ  D:  01/15/2014  T:  01/16/2014  Job:  [id number]

## 2014-01-17 NOTE — ED Provider Notes (Signed)
Medical screening examination/treatment/procedure(s) were performed by non-physician practitioner and as supervising physician I was immediately available for consultation/collaboration.   Candyce ChurnJohn David Wofford III, MD 01/17/14 1044

## 2014-01-30 ENCOUNTER — Ambulatory Visit: Payer: MEDICAID | Admitting: Occupational Therapy

## 2014-02-01 ENCOUNTER — Encounter: Payer: Self-pay | Admitting: Occupational Therapy

## 2014-02-06 ENCOUNTER — Ambulatory Visit: Payer: MEDICAID | Attending: Orthopedic Surgery | Admitting: Occupational Therapy

## 2014-02-06 DIAGNOSIS — M25549 Pain in joints of unspecified hand: Secondary | ICD-10-CM | POA: Insufficient documentation

## 2014-02-06 DIAGNOSIS — Z4789 Encounter for other orthopedic aftercare: Secondary | ICD-10-CM | POA: Insufficient documentation

## 2014-05-23 ENCOUNTER — Encounter (HOSPITAL_COMMUNITY): Payer: Self-pay | Admitting: Emergency Medicine

## 2014-05-23 ENCOUNTER — Emergency Department (INDEPENDENT_AMBULATORY_CARE_PROVIDER_SITE_OTHER)
Admission: EM | Admit: 2014-05-23 | Discharge: 2014-05-23 | Disposition: A | Discharge status: Home / Self Care | Payer: Self-pay | Source: Home / Self Care | Attending: Family Medicine | Admitting: Family Medicine

## 2014-05-23 DIAGNOSIS — J069 Acute upper respiratory infection, unspecified: Secondary | ICD-10-CM

## 2014-05-23 DIAGNOSIS — B9789 Other viral agents as the cause of diseases classified elsewhere: Principal | ICD-10-CM

## 2014-05-23 MED ORDER — PREDNISONE 10 MG PO TABS: 30.0000 mg | ORAL_TABLET | Freq: Every day | ORAL | Status: DC

## 2014-05-23 NOTE — Discharge Instructions (Signed)
Thank you for coming in today. Take prednisone as directed Call or go to the emergency room if you get worse, have trouble breathing, have chest pains, or palpitations.    Upper Respiratory Infection, Adult An upper respiratory infection (URI) is also sometimes known as the common cold. The upper respiratory tract includes the nose, sinuses, throat, trachea, and bronchi. Bronchi are the airways leading to the lungs. Most people improve within 1 week, but symptoms can last up to 2 weeks. A residual cough may last even longer.  CAUSES Many different viruses can infect the tissues lining the upper respiratory tract. The tissues become irritated and inflamed and often become very moist. Mucus production is also common. A cold is contagious. You can easily spread the virus to others by oral contact. This includes kissing, sharing a glass, coughing, or sneezing. Touching your mouth or nose and then touching a surface, which is then touched by another person, can also spread the virus. SYMPTOMS  Symptoms typically develop 1 to 3 days after you come in contact with a cold virus. Symptoms vary from person to person. They may include:  Runny nose.  Sneezing.  Nasal congestion.  Sinus irritation.  Sore throat.  Loss of voice (laryngitis).  Cough.  Fatigue.  Muscle aches.  Loss of appetite.  Headache.  Low-grade fever. DIAGNOSIS  You might diagnose your own cold based on familiar symptoms, since most people get a cold 2 to 3 times a year. Your caregiver can confirm this based on your exam. Most importantly, your caregiver can check that your symptoms are not due to another disease such as strep throat, sinusitis, pneumonia, asthma, or epiglottitis. Blood tests, throat tests, and X-rays are not necessary to diagnose a common cold, but they may sometimes be helpful in excluding other more serious diseases. Your caregiver will decide if any further tests are required. RISKS AND COMPLICATIONS    You may be at risk for a more severe case of the common cold if you smoke cigarettes, have chronic heart disease (such as heart failure) or lung disease (such as asthma), or if you have a weakened immune system. The very young and very old are also at risk for more serious infections. Bacterial sinusitis, middle ear infections, and bacterial pneumonia can complicate the common cold. The common cold can worsen asthma and chronic obstructive pulmonary disease (COPD). Sometimes, these complications can require emergency medical care and may be life-threatening. PREVENTION  The best way to protect against getting a cold is to practice good hygiene. Avoid oral or hand contact with people with cold symptoms. Wash your hands often if contact occurs. There is no clear evidence that vitamin C, vitamin E, echinacea, or exercise reduces the chance of developing a cold. However, it is always recommended to get plenty of rest and practice good nutrition. TREATMENT  Treatment is directed at relieving symptoms. There is no cure. Antibiotics are not effective, because the infection is caused by a virus, not by bacteria. Treatment may include:  Increased fluid intake. Sports drinks offer valuable electrolytes, sugars, and fluids.  Breathing heated mist or steam (vaporizer or shower).  Eating chicken soup or other clear broths, and maintaining good nutrition.  Getting plenty of rest.  Using gargles or lozenges for comfort.  Controlling fevers with ibuprofen or acetaminophen as directed by your caregiver.  Increasing usage of your inhaler if you have asthma. Zinc gel and zinc lozenges, taken in the first 24 hours of the common cold, can shorten the  duration and lessen the severity of symptoms. Pain medicines may help with fever, muscle aches, and throat pain. A variety of non-prescription medicines are available to treat congestion and runny nose. Your caregiver can make recommendations and may suggest nasal or  lung inhalers for other symptoms.  HOME CARE INSTRUCTIONS   Only take over-the-counter or prescription medicines for pain, discomfort, or fever as directed by your caregiver.  Use a warm mist humidifier or inhale steam from a shower to increase air moisture. This may keep secretions moist and make it easier to breathe.  Drink enough water and fluids to keep your urine clear or pale yellow.  Rest as needed.  Return to work when your temperature has returned to normal or as your caregiver advises. You may need to stay home longer to avoid infecting others. You can also use a face mask and careful hand washing to prevent spread of the virus. SEEK MEDICAL CARE IF:   After the first few days, you feel you are getting worse rather than better.  You need your caregiver's advice about medicines to control symptoms.  You develop chills, worsening shortness of breath, or brown or red sputum. These may be signs of pneumonia.  You develop yellow or brown nasal discharge or pain in the face, especially when you bend forward. These may be signs of sinusitis.  You develop a fever, swollen neck glands, pain with swallowing, or white areas in the back of your throat. These may be signs of strep throat. SEEK IMMEDIATE MEDICAL CARE IF:   You have a fever.  You develop severe or persistent headache, ear pain, sinus pain, or chest pain.  You develop wheezing, a prolonged cough, cough up blood, or have a change in your usual mucus (if you have chronic lung disease).  You develop sore muscles or a stiff neck. Document Released: 12/30/2000 Document Revised: 09/28/2011 Document Reviewed: 10/11/2013 [facility] Patient Information 2015 [place], [place]. This information is not intended to replace advice given to you by your health care provider. Make sure you discuss any questions you have with your health care provider.

## 2014-05-23 NOTE — ED Notes (Signed)
C/o cold sx onset yest Sx include productive cough, runny nose, sneezing, congestion Denies f/v/n/d Alert, no signs of acute distress.
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
## 2014-12-05 ENCOUNTER — Emergency Department (HOSPITAL_COMMUNITY)
Admission: EM | Admit: 2014-12-05 | Discharge: 2014-12-05 | Disposition: A | Discharge status: Home / Self Care | Payer: Self-pay | Attending: Emergency Medicine | Admitting: Emergency Medicine

## 2014-12-05 ENCOUNTER — Encounter (HOSPITAL_COMMUNITY): Payer: Self-pay | Admitting: *Deleted

## 2014-12-05 DIAGNOSIS — G479 Sleep disorder, unspecified: Secondary | ICD-10-CM | POA: Insufficient documentation

## 2014-12-05 DIAGNOSIS — K002 Abnormalities of size and form of teeth: Secondary | ICD-10-CM | POA: Insufficient documentation

## 2014-12-05 DIAGNOSIS — K0381 Cracked tooth: Secondary | ICD-10-CM | POA: Insufficient documentation

## 2014-12-05 DIAGNOSIS — J45909 Unspecified asthma, uncomplicated: Secondary | ICD-10-CM | POA: Insufficient documentation

## 2014-12-05 DIAGNOSIS — K0889 Other specified disorders of teeth and supporting structures: Secondary | ICD-10-CM

## 2014-12-05 DIAGNOSIS — K088 Other specified disorders of teeth and supporting structures: Secondary | ICD-10-CM | POA: Insufficient documentation

## 2014-12-05 DIAGNOSIS — Z7952 Long term (current) use of systemic steroids: Secondary | ICD-10-CM | POA: Insufficient documentation

## 2014-12-05 DIAGNOSIS — Z720 Tobacco use: Secondary | ICD-10-CM | POA: Insufficient documentation

## 2014-12-05 MED ORDER — PENICILLIN V POTASSIUM 250 MG PO TABS: 250.0000 mg | ORAL_TABLET | Freq: Four times a day (QID) | ORAL | Status: AC

## 2014-12-05 NOTE — Discharge Instructions (Signed)
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
## 2014-12-05 NOTE — ED Notes (Signed)
Pt reports pain to Lt upper tooth pain Pt also has swelling to Lt side of face.
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
## 2019-02-09 ENCOUNTER — Encounter (HOSPITAL_COMMUNITY): Payer: Self-pay

## 2019-02-09 ENCOUNTER — Other Ambulatory Visit: Payer: Self-pay

## 2019-02-09 ENCOUNTER — Ambulatory Visit (HOSPITAL_COMMUNITY)
Admission: EM | Admit: 2019-02-09 | Discharge: 2019-02-09 | Disposition: A | Discharge status: Home / Self Care | Payer: Self-pay | Attending: Family Medicine | Admitting: Family Medicine

## 2019-02-09 DIAGNOSIS — R42 Dizziness and giddiness: Secondary | ICD-10-CM | POA: Insufficient documentation

## 2019-02-09 DIAGNOSIS — R531 Weakness: Secondary | ICD-10-CM | POA: Insufficient documentation

## 2019-02-09 DIAGNOSIS — F17210 Nicotine dependence, cigarettes, uncomplicated: Secondary | ICD-10-CM | POA: Insufficient documentation

## 2019-02-09 DIAGNOSIS — Z20828 Contact with and (suspected) exposure to other viral communicable diseases: Secondary | ICD-10-CM | POA: Insufficient documentation

## 2019-02-09 DIAGNOSIS — R0602 Shortness of breath: Secondary | ICD-10-CM | POA: Insufficient documentation

## 2019-02-09 LAB — GLUCOSE, CAPILLARY: Glucose-Capillary: 87 mg/dL (ref 70–99)
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
## 2019-02-09 NOTE — ED Triage Notes (Signed)
Pt presents with dizziness, shortness of breath, and general weakness over past week from unknown source.

## 2019-02-09 NOTE — Discharge Instructions (Signed)
Your EKG and blood sugar was normal I believe this may be some sort of viral illness or dehydration. Make sure you are drinking plenty of water and Gatorade with electrolytes to ensure hydration We tested you for COVID based on symptoms We will call you with any positive results Work note given to return when negative COVID results are received.

## 2019-02-12 LAB — NOVEL CORONAVIRUS, NAA (HOSP ORDER, SEND-OUT TO REF LAB; TAT 18-24 HRS): SARS-CoV-2, NAA: NOT DETECTED

## 2019-05-12 ENCOUNTER — Other Ambulatory Visit: Payer: Self-pay

## 2019-05-12 DIAGNOSIS — Z20822 Contact with and (suspected) exposure to covid-19: Secondary | ICD-10-CM

## 2019-05-13 LAB — NOVEL CORONAVIRUS, NAA: SARS-CoV-2, NAA: NOT DETECTED

## 2019-09-01 ENCOUNTER — Ambulatory Visit: Payer: Medicaid Other | Attending: Internal Medicine

## 2019-09-01 DIAGNOSIS — Z20822 Contact with and (suspected) exposure to covid-19: Secondary | ICD-10-CM | POA: Insufficient documentation

## 2019-09-03 LAB — NOVEL CORONAVIRUS, NAA: SARS-CoV-2, NAA: NOT DETECTED

## 2019-09-04 ENCOUNTER — Telehealth: Payer: Self-pay | Admitting: General Practice

## 2019-09-04 NOTE — Telephone Encounter (Signed)
Negative COVID results given. Patient results "NOT Detected." Caller expressed understanding. ° °

## 2019-09-15 ENCOUNTER — Ambulatory Visit: Payer: Medicaid Other | Attending: Internal Medicine

## 2019-09-15 DIAGNOSIS — Z20822 Contact with and (suspected) exposure to covid-19: Secondary | ICD-10-CM | POA: Insufficient documentation

## 2019-09-16 LAB — NOVEL CORONAVIRUS, NAA: SARS-CoV-2, NAA: NOT DETECTED

## 2019-10-10 MED FILL — metroNIDAZOLE 500 MG TABS: 500 | 1 days supply | Qty: 4 | Fill #0

## 2021-09-09 ENCOUNTER — Emergency Department (HOSPITAL_COMMUNITY)
Admission: EM | Admit: 2021-09-09 | Discharge: 2021-09-09 | Disposition: A | Discharge status: Home / Self Care | Payer: Medicaid Other | Attending: Emergency Medicine | Admitting: Emergency Medicine

## 2021-09-09 ENCOUNTER — Encounter (HOSPITAL_COMMUNITY): Payer: Self-pay | Admitting: Emergency Medicine

## 2021-09-09 DIAGNOSIS — K0889 Other specified disorders of teeth and supporting structures: Secondary | ICD-10-CM

## 2021-09-09 DIAGNOSIS — K029 Dental caries, unspecified: Secondary | ICD-10-CM | POA: Insufficient documentation

## 2021-09-09 MED ORDER — PENICILLIN V POTASSIUM 500 MG PO TABS: 500.0000 mg | ORAL_TABLET | Freq: Four times a day (QID) | ORAL | 0 refills | Status: AC

## 2021-09-09 MED ORDER — IBUPROFEN 800 MG PO TABS: 800.0000 mg | ORAL_TABLET | Freq: Three times a day (TID) | ORAL | 0 refills | Status: AC

## 2021-09-09 NOTE — ED Notes (Signed)
Pt verbalized understanding of d/c instructions, meds and followup care. Denies questions. VSS, no distress noted. Steady gait to exit with all belongings.  ?

## 2021-09-09 NOTE — ED Triage Notes (Signed)
Patient complains of top right dental pain that started approximately one month ago and has gotten progressively worse. Patient states he was told to go to ED by [facility] on [facility]. Patient is alert, oriented, and in no apparent distress at this time.

## 2021-09-09 NOTE — Discharge Instructions (Addendum)
I have prescribed antibiotics to help prevent further infection to your mouth. Please take one tablet three times a day for the next 7 days. In addition, I have given you medication for pain control, please take this as directed.   You will need to see a dentist in order to obtain further care for your dental issue.

## 2021-09-09 NOTE — ED Provider Notes (Signed)
[facility] EMERGENCY DEPARTMENT Provider Note   CSN: [id number] Arrival date & time: 09/09/21  9407     History  Chief Complaint  Patient presents with   Dental Pain    Paul Ochoa is a 31 y.o. male.  32 y.o male with no past medical history presents to the ED with a chief complaint of dental pain that is been ongoing for the past month.  According to patient, about a month ago his right upper tooth broke off, this left a chip of the tooth out, yesterday, of another piece of his tooth broke off.  He reports taking ibuprofen 800 mg for pain control without much improvement in symptoms.  Pain is exacerbated with any sort of mastication, also with lying down on the right side of his face at night.  No facial pain, no fever, no difficulty tolerating his secretions.  No recent dental care.  The history is provided by the patient and medical records.  Dental Pain Location:  Upper Upper teeth location:  1/RU 3rd molar Quality:  Constant Severity:  Moderate Onset quality:  Gradual Duration:  1 month Timing:  Constant Progression:  Worsening Context: dental fracture   Associated symptoms: no fever       Home Medications Prior to Admission medications   Medication Sig Start Date End Date Taking? Authorizing Provider  ibuprofen (ADVIL) 800 MG tablet Take 1 tablet (800 mg total) by mouth 3 (three) times daily for 7 days. 09/09/21 09/16/21 Yes Haly Feher, Leonie Douglas, PA-C  penicillin v potassium (VEETID) 500 MG tablet Take 1 tablet (500 mg total) by mouth 4 (four) times daily for 7 days. 09/09/21 09/16/21 Yes Jesicca Dipierro, PA-C  albuterol (PROVENTIL HFA;VENTOLIN HFA) 108 (90 BASE) MCG/ACT inhaler Inhale 2 puffs into the lungs every 6 (six) hours as needed. For shortness of breath    [provider]      Allergies    Coconut oil    Review of Systems   Review of Systems  Constitutional:  Negative for chills and fever.  HENT:  Positive for dental problem.     Physical Exam Updated Vital Signs BP 122/75 (BP Location: Right Arm)    Pulse (!) 58    Temp 98.4 F (36.9 C) (Oral)    Resp 14    SpO2 99%  Physical Exam Vitals and nursing note reviewed.  Constitutional:      Appearance: Normal appearance.  HENT:     Mouth/Throat:     Mouth: Mucous membranes are moist.     Dentition: Dental tenderness and dental caries present. No dental abscesses.     Pharynx: Oropharynx is clear. Uvula midline.     Tonsils: No tonsillar exudate or tonsillar abscesses.  Cardiovascular:     Rate and Rhythm: Normal rate.  Pulmonary:     Effort: Pulmonary effort is normal.  Abdominal:     General: Abdomen is flat.  Musculoskeletal:     Cervical back: Normal range of motion and neck supple.  Skin:    General: Skin is warm and dry.  Neurological:     Mental Status: He is alert and oriented to person, place, and time.    ED Results / Procedures / Treatments   Labs (all labs ordered are listed, but only abnormal results are displayed) Labs Reviewed - No data to display  EKG None  Radiology No results found.  Procedures Procedures    Medications Ordered in ED Medications - No data to display  ED  Course/ Medical Decision Making/ A&P                           Medical Decision Making  Patient presents to the ED with a chief complaint of dental problem that began about a month ago after chipping his tooth, pain has worsened as another piece also chipped off yesterday.  He has not seen a dentist in several years.  During evaluation there is erythema, pain with palpation of the first molar, this is currently in two pieces. We discussed the need for follow-up with dentist.  I have also given him a small prescription for antibiotics to further prevent any infection, also given ibuprofen to help with pain control.  He understands and agrees to management, return precautions discussed at length.  Patient stable for discharge.   Portions of this note were  generated with Scientist, clinical (histocompatibility and immunogenetics). Dictation errors may occur despite best attempts at proofreading.   Final Clinical Impression(s) / ED Diagnoses Final diagnoses:  Pain, dental    Rx / DC Orders ED Discharge Orders          Ordered    ibuprofen (ADVIL) 800 MG tablet  3 times daily        09/09/21 0818    penicillin v potassium (VEETID) 500 MG tablet  4 times daily        09/09/21 0820              Claude Manges, PA-C 09/09/21 0827    Gwyneth Sprout, MD 09/12/21 [phone]

## 2021-09-09 NOTE — ED Notes (Signed)
Pt c/o tooth ache from chipped tooth in upper right molar. Airway intact, in no acute distress.

## 2021-11-27 ENCOUNTER — Ambulatory Visit
Admission: EM | Admit: 2021-11-27 | Discharge: 2021-11-27 | Disposition: A | Discharge status: Home / Self Care | Payer: Medicaid Other | Attending: Student | Admitting: Student

## 2021-11-27 ENCOUNTER — Encounter: Payer: Self-pay | Admitting: Emergency Medicine

## 2021-11-27 DIAGNOSIS — M26609 Unspecified temporomandibular joint disorder, unspecified side: Secondary | ICD-10-CM

## 2021-11-27 MED ORDER — TIZANIDINE HCL 2 MG PO CAPS: 2.0000 mg | ORAL_CAPSULE | Freq: Every evening | ORAL | 0 refills | Status: DC | PRN

## 2021-11-27 MED ORDER — AMOXICILLIN-POT CLAVULANATE 875-125 MG PO TABS: 1.0000 | ORAL_TABLET | Freq: Two times a day (BID) | ORAL | 0 refills | Status: DC

## 2021-11-27 NOTE — Discharge Instructions (Addendum)
-  Start the antibiotic-Augmentin (amoxicillin-clavulanate), 1 pill every 12 hours for 7 days.  You can take this with food like with breakfast and dinner. ?-Start the muscle relaxer-Zanaflex (tizanidine), up to 3 times daily for muscle spasms and pain.  This can make you drowsy, so take at bedtime or when you do not need to drive or operate machinery. ?-You can take Tylenol up to 1000 mg 3 times daily, and ibuprofen up to 600 mg 3 times daily with food.  You can take these together, or alternate every 3-4 hours. ?-Follow-up with dentist if symptoms persist  ?

## 2021-11-27 NOTE — ED Triage Notes (Signed)
Patient c/o right upper sided dental pain for several months.  Patient has not been able to see a dentist due to no insurance.  Patient denies any OTC pain meds. ?
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
## 2022-09-09 ENCOUNTER — Telehealth: Payer: Self-pay

## 2022-09-09 NOTE — Telephone Encounter (Signed)
Mychart msg sent

## 2023-02-15 DIAGNOSIS — S60221A Contusion of right hand, initial encounter: Secondary | ICD-10-CM | POA: Diagnosis not present

## 2023-02-17 ENCOUNTER — Encounter (HOSPITAL_COMMUNITY): Payer: Self-pay

## 2023-02-17 ENCOUNTER — Emergency Department (HOSPITAL_COMMUNITY): Payer: Medicaid Other

## 2023-02-17 ENCOUNTER — Other Ambulatory Visit: Payer: Self-pay

## 2023-02-17 ENCOUNTER — Emergency Department (HOSPITAL_COMMUNITY)
Admission: EM | Admit: 2023-02-17 | Discharge: 2023-02-17 | Disposition: A | Discharge status: Home / Self Care | Payer: Medicaid Other | Attending: Emergency Medicine | Admitting: Emergency Medicine

## 2023-02-17 DIAGNOSIS — M7989 Other specified soft tissue disorders: Secondary | ICD-10-CM | POA: Diagnosis not present

## 2023-02-17 DIAGNOSIS — J45909 Unspecified asthma, uncomplicated: Secondary | ICD-10-CM | POA: Insufficient documentation

## 2023-02-17 DIAGNOSIS — S60221A Contusion of right hand, initial encounter: Secondary | ICD-10-CM | POA: Diagnosis not present

## 2023-02-17 DIAGNOSIS — M79641 Pain in right hand: Secondary | ICD-10-CM | POA: Diagnosis not present

## 2023-02-17 DIAGNOSIS — W228XXA Striking against or struck by other objects, initial encounter: Secondary | ICD-10-CM | POA: Diagnosis not present

## 2023-02-17 DIAGNOSIS — S6991XA Unspecified injury of right wrist, hand and finger(s), initial encounter: Secondary | ICD-10-CM | POA: Diagnosis present

## 2023-02-17 MED ORDER — KETOROLAC TROMETHAMINE 15 MG/ML IJ SOLN
30.0000 mg | Freq: Once | INTRAMUSCULAR | Status: AC
  Administered 2023-02-17: 30 mg via INTRAMUSCULAR
  Filled 2023-02-17: qty 2

## 2023-02-17 NOTE — Progress Notes (Signed)
Orthopedic Tech Progress Note Patient Details:  Paul Ochoa February 19, 1991 [id number] Applied volar splint per order.  Ortho Devices Type of Ortho Device: Short arm splint Ortho Device/Splint Location: RUE Ortho Device/Splint Interventions: Ordered, Application, Adjustment   Post Interventions Patient Tolerated: Fair Instructions Provided: Adjustment of device, Care of device  Blase Mess 02/17/2023, 9:21 AM

## 2023-02-17 NOTE — Discharge Instructions (Signed)
Elevate and don't use

## 2023-02-17 NOTE — ED Triage Notes (Signed)
Patient reports punching a tree with right hand last week. Reports hand swelling and pain.

## 2023-02-17 NOTE — ED Provider Notes (Signed)
[place] EMERGENCY DEPARTMENT AT [facility] Provider Note   CSN: [id number] Arrival date & time: 02/17/23  9147     History  Chief Complaint  Patient presents with   Hand Injury    Paul Ochoa is a 32 y.o. male.  Pt is a 32y/o male with hx of asthma presenting with worsening right hand swelling and pain after punching a tree on Thursday (6days PTA).  No wrist pain or other injury.  No redness or drainage.  Pt is right handed.  The history is provided by the patient.  Hand Injury      Home Medications Prior to Admission medications   Medication Sig Start Date End Date Taking? Authorizing Provider  albuterol (PROVENTIL HFA;VENTOLIN HFA) 108 (90 BASE) MCG/ACT inhaler Inhale 2 puffs into the lungs every 6 (six) hours as needed. For shortness of breath    [provider]  amoxicillin-clavulanate (AUGMENTIN) 875-125 MG tablet Take 1 tablet by mouth every 12 (twelve) hours. 11/27/21   Rhys Martini, PA-C  tizanidine (ZANAFLEX) 2 MG capsule Take 1 capsule (2 mg total) by mouth at bedtime as needed for muscle spasms. 11/27/21   Rhys Martini, PA-C      Allergies    Coconut (cocos nucifera)    Review of Systems   Review of Systems  Physical Exam Updated Vital Signs BP 124/77 (BP Location: Left Arm)   Pulse 78   Temp 98.7 F (37.1 C) (Oral)   Resp 18   Ht 5\' 7"  (1.702 m)   Wt 83.9 kg   SpO2 100%   BMI 28.98 kg/m  Physical Exam Vitals and nursing note reviewed.  Constitutional:      General: He is not in acute distress.    Appearance: Normal appearance.  Cardiovascular:     Rate and Rhythm: Normal rate.  Pulmonary:     Effort: Pulmonary effort is normal.  Musculoskeletal:        General: Swelling and tenderness present.       Hands:  Skin:    Capillary Refill: Capillary refill takes less than 2 seconds.  Neurological:     Mental Status: He is alert. Mental status is at baseline.     ED Results / Procedures / Treatments    Labs (all labs ordered are listed, but only abnormal results are displayed) Labs Reviewed - No data to display  EKG None  Radiology DG Hand Complete Right  Result Date: 02/17/2023 CLINICAL DATA:  Pain and swelling. Punched a tree with right hand last week. Patient reports hurting the same hand in 2015 and having pins placed. EXAM: RIGHT HAND - COMPLETE 3+ VIEW COMPARISON:  Right hand radiographs 02/15/2023 and 01/15/2014 FINDINGS: Neutral ulnar variance. Normal bone mineralization. Joint spaces are preserved. No acute fracture is seen. No healing fracture line sclerosis. There is again mild soft tissue swelling at the dorsal aspect of the metacarpals. IMPRESSION: 1. No acute or healing fracture is seen. 2. Mild soft tissue swelling at the dorsal aspect of the metacarpals. Electronically Signed   By: Neita Garnet M.D.   On: 02/17/2023 09:28    Procedures Procedures    Medications Ordered in ED Medications  ketorolac (TORADOL) 15 MG/ML injection 30 mg (30 mg Intramuscular Given 02/17/23 0845)    ED Course/ Medical Decision Making/ A&P  Medical Decision Making Amount and/or Complexity of Data Reviewed Radiology: ordered and independent interpretation performed. Decision-making details documented in ED Course.  Risk Prescription drug management.   Pt presenting today after punching a tree with concern for metacarpal fx.  I have independently visualized and interpreted pt's images today. No obvious fractures on x-ray however given extent of swelling and pain will splint and f/u with hand surgery.  No sign of infection at this time.          Final Clinical Impression(s) / ED Diagnoses Final diagnoses:  Contusion of right hand, initial encounter    Rx / DC Orders ED Discharge Orders     None         Gwyneth Sprout, MD 02/17/23 1010

## 2023-03-05 ENCOUNTER — Other Ambulatory Visit (HOSPITAL_COMMUNITY): Payer: Self-pay | Admitting: Orthopedic Surgery

## 2023-03-05 DIAGNOSIS — M86149 Other acute osteomyelitis, unspecified hand: Secondary | ICD-10-CM | POA: Diagnosis not present

## 2023-03-05 DIAGNOSIS — M79641 Pain in right hand: Secondary | ICD-10-CM | POA: Diagnosis not present

## 2023-03-06 ENCOUNTER — Inpatient Hospital Stay (HOSPITAL_COMMUNITY)
Admission: EM | Admit: 2023-03-06 | Discharge: 2023-03-09 | DRG: 513 | Disposition: A | Discharge status: Home / Self Care | Payer: Medicaid Other | Attending: Internal Medicine | Admitting: Internal Medicine

## 2023-03-06 ENCOUNTER — Encounter (HOSPITAL_COMMUNITY): Payer: Self-pay

## 2023-03-06 ENCOUNTER — Other Ambulatory Visit: Payer: Self-pay

## 2023-03-06 DIAGNOSIS — L089 Local infection of the skin and subcutaneous tissue, unspecified: Secondary | ICD-10-CM

## 2023-03-06 DIAGNOSIS — Z5941 Food insecurity: Secondary | ICD-10-CM

## 2023-03-06 DIAGNOSIS — F17210 Nicotine dependence, cigarettes, uncomplicated: Secondary | ICD-10-CM | POA: Diagnosis present

## 2023-03-06 DIAGNOSIS — W2209XA Striking against other stationary object, initial encounter: Secondary | ICD-10-CM | POA: Diagnosis present

## 2023-03-06 DIAGNOSIS — J45909 Unspecified asthma, uncomplicated: Secondary | ICD-10-CM | POA: Diagnosis present

## 2023-03-06 DIAGNOSIS — M86141 Other acute osteomyelitis, right hand: Secondary | ICD-10-CM | POA: Diagnosis not present

## 2023-03-06 DIAGNOSIS — B951 Streptococcus, group B, as the cause of diseases classified elsewhere: Secondary | ICD-10-CM | POA: Diagnosis present

## 2023-03-06 DIAGNOSIS — S6981XA Other specified injuries of right wrist, hand and finger(s), initial encounter: Secondary | ICD-10-CM | POA: Diagnosis present

## 2023-03-06 DIAGNOSIS — M25441 Effusion, right hand: Secondary | ICD-10-CM | POA: Diagnosis present

## 2023-03-06 DIAGNOSIS — R200 Anesthesia of skin: Secondary | ICD-10-CM | POA: Diagnosis present

## 2023-03-06 DIAGNOSIS — M009 Pyogenic arthritis, unspecified: Secondary | ICD-10-CM | POA: Diagnosis present

## 2023-03-06 DIAGNOSIS — Z91018 Allergy to other foods: Secondary | ICD-10-CM

## 2023-03-06 MED ORDER — VANCOMYCIN HCL 1750 MG/350ML IV SOLN
1750.0000 mg | Freq: Once | INTRAVENOUS | Status: DC
  Filled 2023-03-06: qty 350

## 2023-03-06 NOTE — ED Provider Notes (Addendum)
[place] EMERGENCY DEPARTMENT AT [facility] Provider Note   CSN: [id number] Arrival date & time: 03/06/23  2101     History  Chief Complaint  Patient presents with   Hand Injury    Paul Ochoa is a 32 y.o. male.  Presents to the emergency department for evaluation of right hand pain.  Patient reports that he injured his hand punching a tree approximately week ago.  He has been following with hand surgery, Dr. Frazier Butt.  He had an MRI yesterday.  Dr. Frazier Butt told him that he has an infection in his hand and he needs to come to the emergency department for admission.  He will go to the OR tomorrow.  Patient reports increased swelling, pain in the hand, tingling of the tip of the middle finger.       Home Medications Prior to Admission medications   Medication Sig Start Date End Date Taking? Authorizing Provider  albuterol (PROVENTIL HFA;VENTOLIN HFA) 108 (90 BASE) MCG/ACT inhaler Inhale 2 puffs into the lungs every 6 (six) hours as needed. For shortness of breath    [provider]  amoxicillin-clavulanate (AUGMENTIN) 875-125 MG tablet Take 1 tablet by mouth every 12 (twelve) hours. 11/27/21   Rhys Martini, PA-C  tizanidine (ZANAFLEX) 2 MG capsule Take 1 capsule (2 mg total) by mouth at bedtime as needed for muscle spasms. 11/27/21   Rhys Martini, PA-C      Allergies    Coconut (cocos nucifera)    Review of Systems   Review of Systems  Physical Exam Updated Vital Signs BP 121/69 (BP Location: Right Arm)   Pulse 80   Temp 99.3 F (37.4 C) (Oral)   Resp 16   Ht 5\' 7"  (1.702 m)   Wt 83.9 kg   SpO2 100%   BMI 28.98 kg/m  Physical Exam Vitals and nursing note reviewed.  Constitutional:      General: He is not in acute distress.    Appearance: He is well-developed.  HENT:     Head: Normocephalic and atraumatic.     Mouth/Throat:     Mouth: Mucous membranes are moist.  Eyes:     General: Vision grossly intact. Gaze aligned  appropriately.     Extraocular Movements: Extraocular movements intact.     Conjunctiva/sclera: Conjunctivae normal.  Cardiovascular:     Rate and Rhythm: Normal rate and regular rhythm.     Pulses: Normal pulses.          Radial pulses are 2+ on the right side.     Heart sounds: Normal heart sounds, S1 normal and S2 normal. No murmur heard.    No friction rub. No gallop.  Pulmonary:     Effort: Pulmonary effort is normal. No respiratory distress.     Breath sounds: Normal breath sounds.  Abdominal:     Palpations: Abdomen is soft.     Tenderness: There is no abdominal tenderness. There is no guarding or rebound.     Hernia: No hernia is present.  Musculoskeletal:        General: No swelling.     Right hand: Swelling and tenderness present. No deformity. Decreased range of motion. Normal sensation. Normal capillary refill. Normal pulse.     Cervical back: Full passive range of motion without pain, normal range of motion and neck supple. No pain with movement, spinous process tenderness or muscular tenderness. Normal range of motion.     Right lower leg: No edema.  Left lower leg: No edema.  Skin:    General: Skin is warm and dry.     Capillary Refill: Capillary refill takes less than 2 seconds.     Findings: No ecchymosis, erythema, lesion or wound.  Neurological:     Mental Status: He is alert and oriented to person, place, and time.     GCS: GCS eye subscore is 4. GCS verbal subscore is 5. GCS motor subscore is 6.     Cranial Nerves: Cranial nerves 2-12 are intact.     Sensory: Sensation is intact.     Motor: Motor function is intact. No weakness or abnormal muscle tone.     Coordination: Coordination is intact.  Psychiatric:        Mood and Affect: Mood normal.        Speech: Speech normal.        Behavior: Behavior normal.     ED Results / Procedures / Treatments   Labs (all labs ordered are listed, but only abnormal results are displayed) Labs Reviewed  CULTURE,  BLOOD (SINGLE)  CBC WITH DIFFERENTIAL/PLATELET  BASIC METABOLIC PANEL  I-STAT CG4 LACTIC ACID, ED    EKG None  Radiology No results found.  Procedures Procedures    Medications Ordered in ED Medications - No data to display  ED Course/ Medical Decision Making/ A&P                                 Medical Decision Making Amount and/or Complexity of Data Reviewed Labs: ordered.  Risk Prescription drug management.   Patient presents to the emergency department for evaluation of right hand infection.  Patient had outpatient MRI yesterday that shows osteomyelitis.  Discussed briefly with Dr. Kerry Fort, on-call for hand.  Dr. Frazier Butt wants the patient admitted to medicine,  Dr. Kerry Fort to confirm that he will go to the OR tomorrow with Dr. Frazier Butt.  Neurovascularly intact.  No drainage.  Good pulses, sensory.  Does not require any urgent surgical intervention tonight.  Addendum: Contacted by Dr. Frazier Butt.  Hold antibiotics, will obtain tissue cultures in the OR tomorrow and then initiate antibiotics.        Final Clinical Impression(s) / ED Diagnoses Final diagnoses:  Other acute osteomyelitis of right hand [facility])    Rx / DC Orders ED Discharge Orders     None         Audon Heymann, Canary Brim, MD 03/06/23 2350    Gilda Crease, MD 03/07/23 [phone]

## 2023-03-06 NOTE — ED Triage Notes (Signed)
Pt presents with swelling and infection in his right hand after punching a tree last week. He reports his surgery is scheduled here tomorrow and his surgeon told him to come to the ER tonight.

## 2023-03-06 NOTE — Progress Notes (Signed)
ED Pharmacy Antibiotic Sign Off An antibiotic consult was received from an ED provider for vancomycin per pharmacy dosing for Osteomyelitis . A chart review was completed to assess appropriateness.   The following one time order(s) were placed:  Vancomycin 1750 mg IV once   Further antibiotic and/or antibiotic pharmacy consults should be ordered by the admitting provider if indicated.   Thank you for allowing pharmacy to participate in this patient's care,  Sherron Monday, PharmD, BCCCP Clinical Pharmacist  Phone: [phone] 03/06/2023 11:58 PM  Please check AMION for all [facility] Pharmacy phone numbers After 10:00 PM, call Main Pharmacy [phone]

## 2023-03-07 ENCOUNTER — Inpatient Hospital Stay (HOSPITAL_COMMUNITY): Payer: Medicaid Other | Admitting: Certified Registered Nurse Anesthetist

## 2023-03-07 ENCOUNTER — Encounter (HOSPITAL_COMMUNITY)
Admission: EM | Disposition: A | Discharge status: Home / Self Care | Payer: Self-pay | Source: Home / Self Care | Attending: Internal Medicine

## 2023-03-07 ENCOUNTER — Encounter (HOSPITAL_COMMUNITY): Payer: Self-pay | Admitting: Internal Medicine

## 2023-03-07 ENCOUNTER — Other Ambulatory Visit: Payer: Self-pay

## 2023-03-07 DIAGNOSIS — B951 Streptococcus, group B, as the cause of diseases classified elsewhere: Secondary | ICD-10-CM | POA: Diagnosis not present

## 2023-03-07 DIAGNOSIS — R200 Anesthesia of skin: Secondary | ICD-10-CM | POA: Diagnosis not present

## 2023-03-07 DIAGNOSIS — S6981XA Other specified injuries of right wrist, hand and finger(s), initial encounter: Secondary | ICD-10-CM | POA: Diagnosis not present

## 2023-03-07 DIAGNOSIS — M86141 Other acute osteomyelitis, right hand: Secondary | ICD-10-CM | POA: Diagnosis not present

## 2023-03-07 DIAGNOSIS — L089 Local infection of the skin and subcutaneous tissue, unspecified: Secondary | ICD-10-CM | POA: Diagnosis not present

## 2023-03-07 DIAGNOSIS — J45909 Unspecified asthma, uncomplicated: Secondary | ICD-10-CM | POA: Diagnosis not present

## 2023-03-07 DIAGNOSIS — L02511 Cutaneous abscess of right hand: Secondary | ICD-10-CM | POA: Diagnosis not present

## 2023-03-07 DIAGNOSIS — Z5941 Food insecurity: Secondary | ICD-10-CM | POA: Diagnosis not present

## 2023-03-07 DIAGNOSIS — F17210 Nicotine dependence, cigarettes, uncomplicated: Secondary | ICD-10-CM | POA: Diagnosis not present

## 2023-03-07 DIAGNOSIS — M25441 Effusion, right hand: Secondary | ICD-10-CM | POA: Diagnosis not present

## 2023-03-07 DIAGNOSIS — Z91018 Allergy to other foods: Secondary | ICD-10-CM | POA: Diagnosis not present

## 2023-03-07 DIAGNOSIS — M00841 Arthritis due to other bacteria, right hand: Secondary | ICD-10-CM | POA: Diagnosis not present

## 2023-03-07 DIAGNOSIS — W2209XA Striking against other stationary object, initial encounter: Secondary | ICD-10-CM | POA: Diagnosis present

## 2023-03-07 DIAGNOSIS — M009 Pyogenic arthritis, unspecified: Secondary | ICD-10-CM | POA: Diagnosis not present

## 2023-03-07 HISTORY — PX: I & D EXTREMITY: SHX5045

## 2023-03-07 LAB — BASIC METABOLIC PANEL
Anion gap: 12 (ref 5–15)
BUN: 8 mg/dL (ref 6–20)
CO2: 25 mmol/L (ref 22–32)
Calcium: 9 mg/dL (ref 8.9–10.3)
Chloride: 103 mmol/L (ref 98–111)
Creatinine, Ser: 1.18 mg/dL (ref 0.61–1.24)
GFR, Estimated: >60 mL/min (ref >60)
Glucose, Bld: 109 mg/dL — ABNORMAL HIGH (ref 70–99)
Potassium: 4 mmol/L (ref 3.5–5.1)
Sodium: 140 mmol/L (ref 135–145)

## 2023-03-07 LAB — CBC WITH DIFFERENTIAL/PLATELET
Abs Immature Granulocytes: 0.02 10*3/uL (ref 0.00–0.07)
Basophils Absolute: 0 10*3/uL (ref 0.0–0.1)
Basophils Relative: 0 %
Eosinophils Absolute: 0.3 10*3/uL (ref 0.0–0.5)
Eosinophils Relative: 3 %
HCT: 40.5 % (ref 39.0–52.0)
Hemoglobin: 13.7 g/dL (ref 13.0–17.0)
Immature Granulocytes: 0 %
Lymphocytes Relative: 23 %
Lymphs Abs: 2.3 10*3/uL (ref 0.7–4.0)
MCH: 31.8 pg (ref 26.0–34.0)
MCHC: 33.8 g/dL (ref 30.0–36.0)
MCV: 94 fL (ref 80.0–100.0)
Monocytes Absolute: 1.1 10*3/uL — ABNORMAL HIGH (ref 0.1–1.0)
Monocytes Relative: 12 %
Neutro Abs: 6 10*3/uL (ref 1.7–7.7)
Neutrophils Relative %: 62 %
Platelets: 311 10*3/uL (ref 150–400)
RBC: 4.31 MIL/uL (ref 4.22–5.81)
RDW: 12.9 % (ref 11.5–15.5)
WBC: 9.7 10*3/uL (ref 4.0–10.5)
nRBC: 0 % (ref 0.0–0.2)

## 2023-03-07 LAB — I-STAT CG4 LACTIC ACID, ED: Lactic Acid, Venous: 0.9 mmol/L (ref 0.5–1.9)

## 2023-03-07 LAB — HIV ANTIBODY (ROUTINE TESTING W REFLEX): HIV Screen 4th Generation wRfx: NONREACTIVE

## 2023-03-07 SURGERY — IRRIGATION AND DEBRIDEMENT EXTREMITY
Anesthesia: General | Laterality: Right

## 2023-03-07 MED ORDER — PIPERACILLIN-TAZOBACTAM 3.375 G IVPB
3.3750 g | Freq: Three times a day (TID) | INTRAVENOUS | Status: DC
  Administered 2023-03-07 – 2023-03-09 (×6): 3.375 g via INTRAVENOUS
  Filled 2023-03-07 (×6): qty 50

## 2023-03-07 MED ORDER — FENTANYL CITRATE (PF) 250 MCG/5ML IJ SOLN
INTRAMUSCULAR | Status: DC | PRN
  Administered 2023-03-07 (×3): 25 ug via INTRAVENOUS
  Administered 2023-03-07 (×2): 50 ug via INTRAVENOUS
  Administered 2023-03-07: 25 ug via INTRAVENOUS
  Administered 2023-03-07: 50 ug via INTRAVENOUS

## 2023-03-07 MED ORDER — VANCOMYCIN HCL IN DEXTROSE 1-5 GM/200ML-% IV SOLN
1000.0000 mg | Freq: Two times a day (BID) | INTRAVENOUS | Status: DC
  Administered 2023-03-08 – 2023-03-09 (×3): 1000 mg via INTRAVENOUS
  Filled 2023-03-07 (×4): qty 200

## 2023-03-07 MED ORDER — OXYCODONE HCL 5 MG/5ML PO SOLN
ORAL | Status: AC
  Filled 2023-03-07: qty 5

## 2023-03-07 MED ORDER — FENTANYL CITRATE (PF) 100 MCG/2ML IJ SOLN
25.0000 ug | INTRAMUSCULAR | Status: DC | PRN
  Administered 2023-03-07 (×3): 50 ug via INTRAVENOUS

## 2023-03-07 MED ORDER — BUPIVACAINE HCL (PF) 0.25 % IJ SOLN
INTRAMUSCULAR | Status: AC
  Filled 2023-03-07: qty 30

## 2023-03-07 MED ORDER — MIDAZOLAM HCL 2 MG/2ML IJ SOLN
INTRAMUSCULAR | Status: DC | PRN
  Administered 2023-03-07 (×2): 1 mg via INTRAVENOUS

## 2023-03-07 MED ORDER — FENTANYL CITRATE (PF) 100 MCG/2ML IJ SOLN
INTRAMUSCULAR | Status: AC
  Filled 2023-03-07: qty 2

## 2023-03-07 MED ORDER — VANCOMYCIN HCL 1500 MG/300ML IV SOLN
1500.0000 mg | Freq: Once | INTRAVENOUS | Status: AC
  Administered 2023-03-07: 1500 mg via INTRAVENOUS
  Filled 2023-03-07: qty 300

## 2023-03-07 MED ORDER — ACETAMINOPHEN 10 MG/ML IV SOLN
1000.0000 mg | Freq: Once | INTRAVENOUS | Status: DC | PRN
  Administered 2023-03-07: 1000 mg via INTRAVENOUS

## 2023-03-07 MED ORDER — CHLORHEXIDINE GLUCONATE 0.12 % MT SOLN: 15.0000 mL | Freq: Once | OROMUCOSAL | Status: AC

## 2023-03-07 MED ORDER — LIDOCAINE 2% (20 MG/ML) 5 ML SYRINGE
INTRAMUSCULAR | Status: DC | PRN
  Administered 2023-03-07: 100 mg via INTRAVENOUS

## 2023-03-07 MED ORDER — NALOXONE HCL 0.4 MG/ML IJ SOLN: 0.4000 mg | INTRAMUSCULAR | Status: DC | PRN

## 2023-03-07 MED ORDER — ORAL CARE MOUTH RINSE: 15.0000 mL | Freq: Once | OROMUCOSAL | Status: AC

## 2023-03-07 MED ORDER — ACETAMINOPHEN 10 MG/ML IV SOLN
INTRAVENOUS | Status: AC
  Filled 2023-03-07: qty 100

## 2023-03-07 MED ORDER — PROPOFOL 10 MG/ML IV BOLUS
INTRAVENOUS | Status: AC
  Filled 2023-03-07: qty 20

## 2023-03-07 MED ORDER — FENTANYL CITRATE (PF) 250 MCG/5ML IJ SOLN
INTRAMUSCULAR | Status: AC
  Filled 2023-03-07: qty 5

## 2023-03-07 MED ORDER — ALBUTEROL SULFATE (2.5 MG/3ML) 0.083% IN NEBU: 3.0000 mL | INHALATION_SOLUTION | Freq: Four times a day (QID) | RESPIRATORY_TRACT | Status: DC | PRN

## 2023-03-07 MED ORDER — PROPOFOL 10 MG/ML IV BOLUS
INTRAVENOUS | Status: DC | PRN
Start: 2023-03-07 — End: 2023-03-07
  Administered 2023-03-07: 200 mg via INTRAVENOUS

## 2023-03-07 MED ORDER — OXYCODONE HCL 5 MG PO TABS
5.0000 mg | ORAL_TABLET | ORAL | Status: DC | PRN
  Administered 2023-03-07 – 2023-03-09 (×5): 5 mg via ORAL
  Filled 2023-03-07 (×6): qty 1

## 2023-03-07 MED ORDER — CHLORHEXIDINE GLUCONATE 0.12 % MT SOLN
OROMUCOSAL | Status: AC
  Administered 2023-03-07: 15 mL via OROMUCOSAL
  Filled 2023-03-07: qty 15

## 2023-03-07 MED ORDER — OXYCODONE HCL 5 MG/5ML PO SOLN
5.0000 mg | Freq: Once | ORAL | Status: AC | PRN
  Administered 2023-03-07: 5 mg via ORAL

## 2023-03-07 MED ORDER — LACTATED RINGERS IV SOLN: INTRAVENOUS | Status: DC

## 2023-03-07 MED ORDER — MORPHINE SULFATE (PF) 4 MG/ML IV SOLN
4.0000 mg | Freq: Once | INTRAVENOUS | Status: AC
  Administered 2023-03-07: 4 mg via INTRAVENOUS
  Filled 2023-03-07: qty 1

## 2023-03-07 MED ORDER — 0.9 % SODIUM CHLORIDE (POUR BTL) OPTIME
TOPICAL | Status: DC | PRN
  Administered 2023-03-07: 1000 mL

## 2023-03-07 MED ORDER — CHLORHEXIDINE GLUCONATE 4 % EX SOLN
Freq: Two times a day (BID) | CUTANEOUS | Status: DC
  Filled 2023-03-07 (×3): qty 15

## 2023-03-07 MED ORDER — HYDROMORPHONE HCL 1 MG/ML IJ SOLN
1.0000 mg | INTRAMUSCULAR | Status: DC | PRN
  Administered 2023-03-07 (×3): 1 mg via INTRAVENOUS
  Filled 2023-03-07 (×3): qty 1

## 2023-03-07 MED ORDER — PHENYLEPHRINE 80 MCG/ML (10ML) SYRINGE FOR IV PUSH (FOR BLOOD PRESSURE SUPPORT)
PREFILLED_SYRINGE | INTRAVENOUS | Status: DC | PRN
  Administered 2023-03-07: 80 ug via INTRAVENOUS

## 2023-03-07 MED ORDER — ONDANSETRON HCL 4 MG/2ML IJ SOLN
4.0000 mg | Freq: Once | INTRAMUSCULAR | Status: AC
  Administered 2023-03-07: 4 mg via INTRAVENOUS
  Filled 2023-03-07: qty 2

## 2023-03-07 MED ORDER — DEXAMETHASONE SODIUM PHOSPHATE 10 MG/ML IJ SOLN
INTRAMUSCULAR | Status: DC | PRN
  Administered 2023-03-07: 10 mg via INTRAVENOUS

## 2023-03-07 MED ORDER — KETOROLAC TROMETHAMINE 30 MG/ML IJ SOLN
30.0000 mg | Freq: Four times a day (QID) | INTRAMUSCULAR | Status: DC
  Administered 2023-03-07 – 2023-03-09 (×7): 30 mg via INTRAVENOUS
  Filled 2023-03-07 (×7): qty 1

## 2023-03-07 MED ORDER — SODIUM CHLORIDE 0.9 % IR SOLN
Status: DC | PRN
  Administered 2023-03-07: 3000 mL

## 2023-03-07 MED ORDER — ONDANSETRON HCL 4 MG/2ML IJ SOLN: 4.0000 mg | Freq: Once | INTRAMUSCULAR | Status: DC | PRN

## 2023-03-07 MED ORDER — MIDAZOLAM HCL 2 MG/2ML IJ SOLN
INTRAMUSCULAR | Status: AC
  Filled 2023-03-07: qty 2

## 2023-03-07 MED ORDER — ONDANSETRON HCL 4 MG/2ML IJ SOLN
INTRAMUSCULAR | Status: DC | PRN
Start: 2023-03-07 — End: 2023-03-07
  Administered 2023-03-07: 4 mg via INTRAVENOUS

## 2023-03-07 MED ORDER — OXYCODONE HCL 5 MG PO TABS: 5.0000 mg | ORAL_TABLET | Freq: Once | ORAL | Status: AC | PRN

## 2023-03-07 MED ORDER — HYDROMORPHONE HCL 1 MG/ML IJ SOLN
1.0000 mg | INTRAMUSCULAR | Status: DC | PRN
  Administered 2023-03-07 – 2023-03-08 (×3): 1 mg via INTRAVENOUS
  Filled 2023-03-07 (×4): qty 1

## 2023-03-07 SURGICAL SUPPLY — 72 items
APL PRP STRL LF DISP 70% ISPRP (MISCELLANEOUS) ×1
BAG COUNTER SPONGE SURGICOUNT (BAG) ×1 IMPLANT
BAG SPNG CNTER NS LX DISP (BAG) ×1
BNDG CMPR 5X2 KNTD ELC UNQ LF (GAUZE/BANDAGES/DRESSINGS) ×1
BNDG CMPR 5X3 KNIT ELC UNQ LF (GAUZE/BANDAGES/DRESSINGS) ×1
BNDG CMPR 75X21 PLY HI ABS (MISCELLANEOUS) ×1
BNDG CMPR 9X4 STRL LF SNTH (GAUZE/BANDAGES/DRESSINGS)
BNDG ELASTIC 2INX 5YD STR LF (GAUZE/BANDAGES/DRESSINGS) IMPLANT
BNDG ELASTIC 2X5.8 VLCR STR LF (GAUZE/BANDAGES/DRESSINGS) ×1 IMPLANT
BNDG ELASTIC 3INX 5YD STR LF (GAUZE/BANDAGES/DRESSINGS) ×1 IMPLANT
BNDG ELASTIC 4X5.8 VLCR STR LF (GAUZE/BANDAGES/DRESSINGS) ×1 IMPLANT
BNDG ESMARK 4X9 LF (GAUZE/BANDAGES/DRESSINGS) IMPLANT
BNDG GAUZE DERMACEA FLUFF 4 (GAUZE/BANDAGES/DRESSINGS) ×2 IMPLANT
BNDG GZE DERMACEA 4 6PLY (GAUZE/BANDAGES/DRESSINGS)
CHLORAPREP W/TINT 26 (MISCELLANEOUS) ×1 IMPLANT
CNTNR URN SCR LID CUP LEK RST (MISCELLANEOUS) IMPLANT
CONT SPEC 4OZ STRL OR WHT (MISCELLANEOUS) ×1
CORD BIPOLAR FORCEPS 12FT (ELECTRODE) ×1 IMPLANT
COVER BACK TABLE 60X90IN (DRAPES) IMPLANT
COVER MAYO STAND STRL (DRAPES) ×1 IMPLANT
COVER SURGICAL LIGHT HANDLE (MISCELLANEOUS) ×1 IMPLANT
CUFF TOURN SGL QUICK 18X4 (TOURNIQUET CUFF) ×1 IMPLANT
CUFF TOURN SGL QUICK 24 (TOURNIQUET CUFF)
CUFF TRNQT CYL 24X4X16.5-23 (TOURNIQUET CUFF) IMPLANT
DRAIN PENROSE 18X1/4 LTX STRL (DRAIN) IMPLANT
DRAPE HALF SHEET 40X57 (DRAPES) ×1 IMPLANT
DRAPE OEC MINIVIEW 54X84 (DRAPES) IMPLANT
DRAPE SURG 17X23 STRL (DRAPES) ×1 IMPLANT
DRSG ADAPTIC 3X8 NADH LF (GAUZE/BANDAGES/DRESSINGS) ×1 IMPLANT
DRSG XEROFORM 1X8 (GAUZE/BANDAGES/DRESSINGS) IMPLANT
GAUZE SPONGE 4X4 12PLY STRL (GAUZE/BANDAGES/DRESSINGS) ×1 IMPLANT
GAUZE STRETCH 2X75IN STRL (MISCELLANEOUS) IMPLANT
GAUZE XEROFORM 1X8 LF (GAUZE/BANDAGES/DRESSINGS) ×1 IMPLANT
GLOVE BIO SURGEON STRL SZ7 (GLOVE) ×1 IMPLANT
GLOVE SURG UNDER POLY LF SZ7 (GLOVE) ×1 IMPLANT
GOWN STRL REUS W/ TWL XL LVL3 (GOWN DISPOSABLE) ×2 IMPLANT
GOWN STRL REUS W/TWL XL LVL3 (GOWN DISPOSABLE) ×2
IV CATH 18G X1.75 CATHLON (IV SOLUTION) IMPLANT
KIT BASIN OR (CUSTOM PROCEDURE TRAY) ×1 IMPLANT
KIT TURNOVER KIT B (KITS) ×1 IMPLANT
LOOP VASCLR MAXI BLUE 18IN ST (MISCELLANEOUS) IMPLANT
LOOP VASCULAR MAXI 18 BLUE (MISCELLANEOUS)
LOOPS VASCLR MAXI BLUE 18IN ST (MISCELLANEOUS) IMPLANT
MANIFOLD NEPTUNE II (INSTRUMENTS) IMPLANT
NDL HYPO 25GX1X1/2 BEV (NEEDLE) IMPLANT
NDL HYPO 25X1 1.5 SAFETY (NEEDLE) ×1 IMPLANT
NDL KEITH (NEEDLE) IMPLANT
NEEDLE HYPO 25GX1X1/2 BEV (NEEDLE) ×1 IMPLANT
NEEDLE HYPO 25X1 1.5 SAFETY (NEEDLE) IMPLANT
NEEDLE KEITH (NEEDLE) IMPLANT
NS IRRIG 1000ML POUR BTL (IV SOLUTION) ×1 IMPLANT
PACK ORTHO EXTREMITY (CUSTOM PROCEDURE TRAY) ×1 IMPLANT
PAD ABD 8X10 STRL (GAUZE/BANDAGES/DRESSINGS) ×1 IMPLANT
PAD ARMBOARD 7.5X6 YLW CONV (MISCELLANEOUS) ×1 IMPLANT
PAD CAST 4YDX4 CTTN HI CHSV (CAST SUPPLIES) ×2 IMPLANT
PADDING CAST ABS COTTON 3X4 (CAST SUPPLIES) ×1 IMPLANT
PADDING CAST COTTON 4X4 STRL (CAST SUPPLIES)
SPLINT PLASTER CAST XFAST 3X15 (CAST SUPPLIES) ×1 IMPLANT
SPONGE T-LAP 4X18 ~~LOC~~+RFID (SPONGE) ×1 IMPLANT
SUT FIBERWIRE 3-0 18 TAPR NDL (SUTURE)
SUTURE FIBERWR 3-0 18 TAPR NDL (SUTURE) IMPLANT
SWAB COLLECTION DEVICE MRSA (MISCELLANEOUS) IMPLANT
SWAB CULTURE ESWAB REG 1ML (MISCELLANEOUS) IMPLANT
SYR BULB EAR ULCER 3OZ GRN STR (SYRINGE) ×1 IMPLANT
SYR CONTROL 10ML LL (SYRINGE) IMPLANT
TOWEL GREEN STERILE (TOWEL DISPOSABLE) ×1 IMPLANT
TOWEL GREEN STERILE FF (TOWEL DISPOSABLE) ×2 IMPLANT
TUBE CONNECTING 12X1/4 (SUCTIONS) IMPLANT
UNDERPAD 30X36 HEAVY ABSORB (UNDERPADS AND DIAPERS) ×1 IMPLANT
VASCULAR TIE MAXI BLUE 18IN ST (MISCELLANEOUS)
WATER STERILE IRR 1000ML POUR (IV SOLUTION) ×1 IMPLANT
YANKAUER SUCT BULB TIP NO VENT (SUCTIONS) IMPLANT

## 2023-03-07 NOTE — Progress Notes (Signed)
Progress Note   Patient: Paul Ochoa ZHY:[id number] DOB: [birth year] DOA: 03/06/2023     0 DOS: the patient was seen and examined on 03/07/2023   Brief hospital course: Paul Ochoa is a 32 y.o. male with medical history significant of asthma, marijuana and tobacco abuse.  He was seen in the ED last month on 7/31 for right hand swelling and pain after punching a tree.  X-ray was negative for fracture.  Splint placed and he was advised to follow-up with hand surgery.  Patient returns to the ED today complaining of swelling and infection of his right hand.  He reports that he saw an surgeon Dr. Frazier Butt and had an MRI done yesterday which was concerning for infection in his hand so he was advised to come to the ED to be admitted for surgery tomorrow.  Patient reports increasing swelling and pain in his hand.  Reports drainage of pus from the dorsum of his hand.  Reports some numbness at the tip of his third digit.  Reports subjective fevers and chills.  No other complaints.   ED Course: Vital signs stable.  Labs showing no leukocytosis, lactic acid normal, blood culture collected.  EDP spoke to Dr. Frazier Butt who requested admission by medicine service and holding antibiotics at this time.  Patient is scheduled to go to the OR in the morning where they will obtain tissue cultures and then initiate antibiotics.  Patient received morphine and Zofran.  Assessment and Plan: Right hand infection No fever or leukocytosis.  Lactic acid normal.  No signs of sepsis at this time.  Neurovascularly intact and no drainage.  Hand surgery planning on taking him to the OR today and recommended holding off starting antibiotics until tissue culture is obtained.  Blood culture pending.  Continue pain management.   Asthma Stable, no signs of acute exacerbation at this time.  Continue albuterol as needed.  Subjective: Patient reports continued pain in his hand.  He is n.p.o. for surgery today  Physical  Exam: Vitals:   03/07/23 0301 03/07/23 0726 03/07/23 1024 03/07/23 1030  BP: 115/60 104/65 114/64   Pulse: 67 60 (!) 56   Resp: 17 16 15    Temp: 98.7 F (37.1 C) 98.2 F (36.8 C) 98.3 F (36.8 C)   TempSrc: Oral Oral Oral   SpO2: 99% 97% 99%   Weight:    83.9 kg  Height:    5' 7.01" (1.702 m)   Physical Examination: General appearance - alert, well appearing, and in no distress Chest - clear to auscultation, no wheezes, rales or rhonchi, symmetric air entry Heart - normal rate and regular rhythm Abdomen - soft, nontender, nondistended, no masses or organomegaly Extremities -significant swelling in his right hand and especially the third finger with immobility for flexion     Data Reviewed: Results for orders placed or performed during the hospital encounter of 03/06/23 (from the past 24 hour(s))  CBC with Differential/Platelet     Status: Abnormal   Collection Time: 03/06/23 11:51 PM  Result Value Ref Range   WBC 9.7 4.0 - 10.5 K/uL   RBC 4.31 4.22 - 5.81 MIL/uL   Hemoglobin 13.7 13.0 - 17.0 g/dL   HCT 29.5 28.4 - 13.2 %   MCV 94.0 80.0 - 100.0 fL   MCH 31.8 26.0 - 34.0 pg   MCHC 33.8 30.0 - 36.0 g/dL   RDW 44.0 10.2 - 72.5 %   Platelets 311 150 - 400 K/uL   nRBC 0.0 0.0 -  0.2 %   Neutrophils Relative % 62 %   Neutro Abs 6.0 1.7 - 7.7 K/uL   Lymphocytes Relative 23 %   Lymphs Abs 2.3 0.7 - 4.0 K/uL   Monocytes Relative 12 %   Monocytes Absolute 1.1 (H) 0.1 - 1.0 K/uL   Eosinophils Relative 3 %   Eosinophils Absolute 0.3 0.0 - 0.5 K/uL   Basophils Relative 0 %   Basophils Absolute 0.0 0.0 - 0.1 K/uL   Immature Granulocytes 0 %   Abs Immature Granulocytes 0.02 0.00 - 0.07 K/uL  Basic metabolic panel     Status: Abnormal   Collection Time: 03/06/23 11:51 PM  Result Value Ref Range   Sodium 140 135 - 145 mmol/L   Potassium 4.0 3.5 - 5.1 mmol/L   Chloride 103 98 - 111 mmol/L   CO2 25 22 - 32 mmol/L   Glucose, Bld 109 (H) 70 - 99 mg/dL   BUN 8 6 - 20 mg/dL    Creatinine, Ser 4.09 0.61 - 1.24 mg/dL   Calcium 9.0 8.9 - 81.1 mg/dL   GFR, Estimated >91 >47 mL/min   Anion gap 12 5 - 15  I-Stat CG4 Lactic Acid     Status: None   Collection Time: 03/07/23 12:11 AM  Result Value Ref Range   Lactic Acid, Venous 0.9 0.5 - 1.9 mmol/L  HIV Antibody (routine testing w rflx)     Status: None   Collection Time: 03/07/23  4:43 AM  Result Value Ref Range   HIV Screen 4th Generation wRfx Non Reactive Non Reactive     Family Communication: Patient and significant other at bedside  Disposition: Status is: Inpatient Remains inpatient appropriate because: Need for IV hydration, IV antibiotics   Planned Discharge Destination: Home DVT prophylaxis: SCDs Time spent: 31 minutes  Author: Reva Bores, MD 03/07/2023 10:58 AM  For on call review [email or web address].
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
## 2023-03-07 NOTE — Op Note (Signed)
Date of Surgery: 03/07/2023  INDICATIONS: Patient is a 32 y.o.-year-old male with a right hand infection.  Patient punched a tree approximately 3 weeks ago.  He was seen in the ER and was found to have a small wound.  He was encouraged to follow-up with orthopedics.  Unfortunately, however, he did not follow-up with anyone until this Friday when he was seen in my office.  He was found to have a draining wound over the dorsal aspect of the third MCP joint.  X-rays showed osteolysis of the metacarpal head concerning for osteomyelitis.  A stat MRI was obtained.  The MRI showed a large joint effusion consistent with septic arthritis with signal change in the metacarpal head consistent with acute osteomyelitis.  He also had an abscess that seem to be originating from the joint capsule and extending into the palmar aspect of the hand.  I discussed the severity of this infection with the patient.  I encouraged him to come to the emergency room over the weekend for admission and surgical debridement of the infection.  Risks, benefits, and alternatives to surgery were again discussed with the patient in the preoperative area. The patient wishes to proceed with surgery.  Informed consent was signed after our discussion.   PREOPERATIVE DIAGNOSIS:  Right third MCP joint septic arthritis Acute osteomyelitis of the third metacarpal head Right palmar abscess, complicated  POSTOPERATIVE DIAGNOSIS: Same.  PROCEDURE:  Arthrotomy of MCP joint for irrigation and drainage of septic arthritis (84696) Debridement of third metacarpal head for osteomyelitis (opening of bone cortex for osteomyelitis, 29528) Irrigation and drainage of palmar abscess secondary to osteomyelitis, complicated (41324) Open release of middle finger A1 pulley (40102)  SURGEON: Waylan Rocher, M.D.  ASSIST: None  ANESTHESIA:  general  IV FLUIDS AND URINE: See anesthesia.  ESTIMATED BLOOD LOSS: 5 mL.  IMPLANTS: * No implants in log *    DRAINS: Penrose x 2  COMPLICATIONS: None  SPECIMENS: Culture swab x 1  DESCRIPTION OF PROCEDURE: The patient was met in the preoperative holding area where the surgical site was marked and the consent form was signed.  The patient was then taken to the operating room and transferred to the operating table.  All bony prominences were well padded.  A tourniquet was applied to the right forearm.  General endotracheal anesthesia was induced.  The operative extremity was prepped and draped in the usual and sterile fashion.  A formal time-out was performed to confirm that this was the correct patient, surgery, side, and site.   Following formal timeout, the limb was exsanguinated by gravity and the tourniquet inflated to 250 mmHg.  I began by making a longitudinal incision over the dorsal aspect of the third MCP joint.  The skin was incised.  A small amount of purulence was encountered.  There was some necrotic appearing tissue which was debrided sharply using a rondure.  The extensor apparatus and sagittal bands was elevated and retracted distally giving access to the MCP joint.  The joint capsule was thickened and necrotic appearing.  This was debrided using a rondure.  To gain better access to the MCP joint, and made a small incision in the proximal aspect of the ulnar sagittal band.  This allowed better retraction of the extensor apparatus and access to the MCP joint.  There was abundant purulent material within the MCP joint.  Culture swabs of this area were taken.  I then performed a synovectomy using a rongeur.  This tissue was also sent  for culture.  I used a rongeur to debride all nonviable or necrotic appearing tissue within the MCP joint.  I found that there was some softening of the bone at the dorsal aspect of metacarpal head.  This was debrided using a house curette.  There appeared to be significant chondrolysis involving both the metacarpal head and the proximal phalangeal base.  There was  almost complete loss of cartilage from the metacarpal head.  Following thorough irrigation and debridement of the MCP joint, I turned my attention to the volar aspect of the hand.  An incision was made just radial to midline of the palm in line with the A1 pulley and extending proximally in a more radial direction.  Full-thickness skin flaps were elevated.  There is immediate purulence encountered in this area.  Blunt dissection was used to identify the common neurovascular bundle in the second webspace.  This was gently retracted radially.  There was some necrotic appearing fat directly overlying the middle finger A1 pulley blade.  This was debrided using a rondure.  The A1 pulley was identified and was incised longitudinally.  There was no purulence within the flexor tendon sheath, however, there did appear to be a tract extending dorsal to the flexor tendon sheath to the MCP joint.  Tissue along the sinus tract was debrided using a rongeur.   At this point, return to the dorsal aspect of the hand.  The joint was thoroughly irrigated using a combination of small bulb syringe and low flow cystoscopy tubing.  I then turned back to the volar aspect the hand and thoroughly irrigated this area again using a combination of bulb syringe and low flow cystoscopy tubing.  Following a very thorough irrigation and debridement, the central portion of both wound was closed using a 4-0 nylon suture.  A Penrose drain was passed dorsally into the MCP joint.  Care was taken not to sew this drain in place.  A second Penrose drain was then placed volarly from the skin incision to behind the middle finger flexor tendons.  The wounds were then dressed with Xeroform, folded 4 x 4's, Kling wrap, and an Ace wrap.  The tourniquet was deflated.  The fingers were all warm, pink, and well-perfused with brisk capillary refill thinning of the procedure.  The patient was reversed from anesthesia and extubated uneventfully.  They were  transferred from the operating table to the postoperative bed.  All counts were correct x 2 at the end of the procedure.  The patient was then taken to the PACU in stable condition.   POSTOPERATIVE PLAN: He will remain admitted to the hospitalist service.  Will follow up on intraoperative cultures.  Recommend ID consult to help guide antibiotic choice and duration for diagnosis of acute osteomyelitis.  Will start BID wound care on POD 1 with soaks in warm, diluted Hibiclens solution.  Disposition pending culture results and final abx recs.   Waylan Rocher, MD 11:56 AM

## 2023-03-07 NOTE — Interval H&P Note (Signed)
History and Physical Interval Note:  03/07/2023 11:38 AM  Paul Ochoa  has presented today for surgery, with the diagnosis of RIGHT HAND INFECTION.  The various methods of treatment have been discussed with the patient and family. After consideration of risks, benefits and other options for treatment, the patient has consented to  Procedure(s): IRRIGATION AND DEBRIDEMENT, HAND (Right) as a surgical intervention.  The patient's history has been reviewed, patient examined, no change in status, stable for surgery.  I have reviewed the patient's chart and labs.  Questions were answered to the patient's satisfaction.     Sondos Wolfman Shelma Eiben

## 2023-03-07 NOTE — Hospital Course (Signed)
Paul Ochoa is a 32 y.o. male with medical history significant of asthma, marijuana and tobacco abuse.  He was seen in the ED last month on 7/31 for right hand swelling and pain after punching a tree.  X-ray was negative for fracture.  Splint placed and he was advised to follow-up with hand surgery.  Patient returns to the ED today complaining of swelling and infection of his right hand.  He reports that he saw an surgeon Dr. Frazier Butt and had an MRI done yesterday which was concerning for infection in his hand so he was advised to come to the ED to be admitted for surgery tomorrow.  Patient reports increasing swelling and pain in his hand.  Reports drainage of pus from the dorsum of his hand.  Reports some numbness at the tip of his third digit.  Reports subjective fevers and chills.  No other complaints.   ED Course: Vital signs stable.  Labs showing no leukocytosis, lactic acid normal, blood culture collected.  EDP spoke to Dr. Frazier Butt who requested admission by medicine service and holding antibiotics at this time.  Patient is scheduled to go to the OR in the morning where they will obtain tissue cultures and then initiate antibiotics.  Patient received morphine and Zofran.

## 2023-03-07 NOTE — Plan of Care (Signed)
Problem: Education: Goal: Knowledge of General Education information will improve Description: Including pain rating scale, medication(s)/side effects and non-pharmacologic comfort measures Outcome: Progressing Pt understands he was admitted into the hospital for a right hand infection.  He punched a tree approximately 3 weeks ago and sustained a draining wound over the dorsal aspect of the third MCP joint. X-rays showed osteolysis of the metacarpal head concerning for osteomyelitis.  Today he had an I/D of the site.  Pt will remain on IV abx per MD's orders.      Problem: Clinical Measurements: Goal: Ability to maintain clinical measurements within normal limits will improve Outcome: Progressing Pt's slightly hypertensive and bradycardic this shift MD is aware.    Problem: Clinical Measurements: Goal: Will remain free from infection Outcome: Progressing S/Sx of infection monitored and assessed q8 hours.  Pt has remained afebrile thus far.  He is on IV abx per MD's orders.      Problem: Clinical Measurements: Goal: Respiratory complications will improve Outcome: Progressing Respiratory status monitored and assessed q8 hours.  Pt is on room air with PO2 saturations at 97-100% and respirations of 11-18 breaths per minute.  He has not endorsed c/o SOB and DOE.    Problem: Activity: Goal: Risk for activity intolerance will decrease Outcome: Progressing Pt is independent of all his ADLs. He does not need the assistance of RN staff to get OOB.  He was observed ambulating in his room with a steady gait.   Problem: Nutrition: Goal: Adequate nutrition will be maintained Outcome: Progressing Pt was NPO for a procedure but is now on a regular diet per MD's orders.  He has been able to tolerate his diet without s/sx of n/v or abdominal pain/ distention.     Problem: Elimination: Goal: Will not experience complications related to bowel motility Outcome: Progressing Pt LBM was in 03/07/2023.  He  has not endorse c/o constipation.    Problem: Elimination: Goal: Will not experience complications related to urinary retention Outcome: Progressing Pt has denied c/o dysuria or abdominal distention/ pain.   Problem: Pain Management: Goal: General experience of comfort will improve Outcome: Progressing Pt has endorsed c/o 2-10/10 right hand pain describing it as a constant sharp, stabbing, throbbing pain.  Reiterated pain scale so he could adequately rate his pain.  Pt stated his pain goal this admission 0/10.  Discussed nonpharmacological methods to help reduce s/sx of pain.  Interventions given per pt's request and MD's orders.    Problem: Safety: Goal: Ability to remain free from injury will improve Outcome: Progressing Pt has remained free from falls thus far.  Instructed pt to utilize RN call light for assistance.  Hourly rounds performed.  Bed alarm implemented to keep pt safe from falls.  Settings activated to third most sensitive mode.  Bed in lowest position, locked with two upper side rails engaged.  Belongings and call light within reach.    Problem: Skin Integrity: Goal: Risk for impaired skin integrity will decrease Outcome: Progressing Skin integrity monitored and assessed q-shift. Pt is on q2 hourly turns to prevent further skin impairment.  Tubes and drains assessed for device related pressure sores.  Pt is continent of both bowel and bladder

## 2023-03-07 NOTE — Plan of Care (Signed)
  Problem: Health Behavior/Discharge Planning: Goal: Ability to manage health-related needs will improve Outcome: Progressing   

## 2023-03-07 NOTE — Anesthesia Procedure Notes (Signed)
Procedure Name: LMA Insertion Date/Time: 03/07/2023 12:07 PM  Performed by: Marena Chancy, CRNAPre-anesthesia Checklist: Patient identified, Emergency Drugs available, Suction available and Patient being monitored Patient Re-evaluated:Patient Re-evaluated prior to induction Oxygen Delivery Method: Circle System Utilized Preoxygenation: Pre-oxygenation with 100% oxygen Induction Type: IV induction Ventilation: Mask ventilation without difficulty LMA: LMA inserted LMA Size: 4.0 Number of attempts: 1 Airway Equipment and Method: Bite block Placement Confirmation: positive ETCO2 Tube secured with: Tape Dental Injury: Teeth and Oropharynx as per pre-operative assessment

## 2023-03-07 NOTE — Transfer of Care (Signed)
Immediate Anesthesia Transfer of Care Note  Patient: Paul Ochoa  Procedure(s) Performed: IRRIGATION AND DEBRIDEMENT, HAND (Right)  Patient Location: PACU  Anesthesia Type:General  Level of Consciousness: awake, alert , and oriented  Airway & Oxygen Therapy: Patient Spontanous Breathing  Post-op Assessment: Report given to RN and Post -op Vital signs reviewed and stable  Post vital signs: Reviewed and stable  Last Vitals:  Vitals Value Taken Time  BP 130/87 03/07/23 1330  Temp 36.5 C 03/07/23 1318  Pulse 65 03/07/23 1334  Resp 8 03/07/23 1334  SpO2 100 % 03/07/23 1334  Vitals shown include unfiled device data.  Last Pain:  Vitals:   03/07/23 1024  TempSrc: Oral  PainSc:       Patients Stated Pain Goal: 0 (03/07/23 0946)  Complications: No notable events documented.

## 2023-03-07 NOTE — Anesthesia Postprocedure Evaluation (Signed)
Anesthesia Post Note  Patient: Paul Ochoa  Procedure(s) Performed: IRRIGATION AND DEBRIDEMENT, HAND (Right)     Patient location during evaluation: PACU Anesthesia Type: General Level of consciousness: awake and alert Pain management: pain level controlled Vital Signs Assessment: post-procedure vital signs reviewed and stable Respiratory status: spontaneous breathing, nonlabored ventilation, respiratory function stable and patient connected to nasal cannula oxygen Cardiovascular status: blood pressure returned to baseline and stable Postop Assessment: no apparent nausea or vomiting Anesthetic complications: no   No notable events documented.  Last Vitals:  Vitals:   03/07/23 1409 03/07/23 1420  BP:  (!) 154/96  Pulse: (!) 59 82  Resp: 14 18  Temp:  36.6 C  SpO2: 97% 99%    Last Pain:  Vitals:   03/07/23 1424  TempSrc:   PainSc: 10-Worst pain ever                 Mariann Barter

## 2023-03-07 NOTE — Plan of Care (Signed)

## 2023-03-07 NOTE — Progress Notes (Signed)
Pharmacy Antibiotic Note  Paul Ochoa is a 32 y.o. male admitted on 03/06/2023 with R hand infection, osteomyelitis s/p I&D.  Pharmacy has been consulted for vancomycin and zosyn dosing.  Plan: Zosyn 3.375gm IV q8h (4hr extended infusions) Vancomycin 1500mg  IV x 1, then 1g IV q12h for estimated AUC 442 using SCr 1.18, Vd 0.72 Check vancomycin levels at steady state, goal AUC 400-550 Follow up renal function, cultures as available, clinical progress, length of tx  Height: 5' 7.01" (170.2 cm) Weight: 83.9 kg (185 lb) IBW/kg (Calculated) : 66.12  Temp (24hrs), Avg:98.4 F (36.9 C), Min:97.7 F (36.5 C), Max:99.3 F (37.4 C)  Recent Labs  Lab 03/06/23 2351 03/07/23 0011  WBC 9.7  --   CREATININE 1.18  --   LATICACIDVEN  --  0.9    Estimated Creatinine Clearance: 93.9 mL/min (by C-G formula based on SCr of 1.18 mg/dL).    Allergies  Allergen Reactions   Coconut (Cocos Nucifera) Anaphylaxis    Mouth swelling    Antimicrobials this admission: 8/18 Vanc >> 8/18 Zosyn >>  Dose adjustments this admission:  Microbiology results: 8/18 wound:  Thank you for allowing pharmacy to be a part of this patient's care.  Loralee Pacas, PharmD, BCPS 03/07/2023 2:29 PM  Please check AMION for all [facility] phone numbers After 10:00 PM, call Main Pharmacy [phone]

## 2023-03-07 NOTE — Anesthesia Preprocedure Evaluation (Signed)
Anesthesia Evaluation  Patient identified by MRN, date of birth, ID band Patient awake    Reviewed: Allergy & Precautions, NPO status , Patient's Chart, lab work & pertinent test results, reviewed documented beta blocker date and time   History of Anesthesia Complications Negative for: history of anesthetic complications  Airway Mallampati: II  TM Distance: >3 FB Neck ROM: Full    Dental no notable dental hx.    Pulmonary asthma , neg sleep apnea, neg recent URI, Current Smoker and Patient abstained from smoking.   breath sounds clear to auscultation       Cardiovascular (-) hypertension(-) angina (-) CAD, (-) Past MI, (-) Cardiac Stents and (-) CABG (-) dysrhythmias (-) Valvular Problems/Murmurs Rhythm:Regular Rate:Normal     Neuro/Psych neg Headaches, neg Seizures    GI/Hepatic ,neg GERD  ,,(+) neg Cirrhosis        Endo/Other  neg diabetes    Renal/GU Renal disease     Musculoskeletal   Abdominal   Peds  Hematology   Anesthesia Other Findings   Reproductive/Obstetrics                              Anesthesia Physical Anesthesia Plan  ASA: 2  Anesthesia Plan: General   Post-op Pain Management:    Induction: Intravenous  PONV Risk Score and Plan: 1 and Ondansetron  Airway Management Planned: LMA  Additional Equipment:   Intra-op Plan:   Post-operative Plan: Extubation in OR  Informed Consent:      Dental advisory given  Plan Discussed with: CRNA  Anesthesia Plan Comments:          Anesthesia Quick Evaluation

## 2023-03-07 NOTE — Plan of Care (Signed)
  Problem: Health Behavior/Discharge Planning: Goal: Ability to manage health-related needs will improve 03/07/2023 0534 by Saddie Benders, RN Outcome: Progressing 03/07/2023 0229 by Saddie Benders, RN Outcome: Progressing

## 2023-03-08 ENCOUNTER — Encounter (HOSPITAL_COMMUNITY): Payer: Self-pay | Admitting: Orthopedic Surgery

## 2023-03-08 ENCOUNTER — Encounter (HOSPITAL_COMMUNITY): Payer: Self-pay

## 2023-03-08 ENCOUNTER — Ambulatory Visit (HOSPITAL_COMMUNITY): Payer: Medicaid Other | Attending: Orthopedic Surgery

## 2023-03-08 DIAGNOSIS — L089 Local infection of the skin and subcutaneous tissue, unspecified: Secondary | ICD-10-CM | POA: Diagnosis not present

## 2023-03-08 NOTE — Plan of Care (Signed)
Problem: Education: Goal: Knowledge of General Education information will improve Description: Including pain rating scale, medication(s)/side effects and non-pharmacologic comfort measures Outcome: Progressing Pt understands he was admitted into the hospital for a right hand infection.  He punched a tree approximately 3 weeks ago and sustained a draining wound over the dorsal aspect of the third MCP joint. X-rays showed osteolysis of the metacarpal head concerning for osteomyelitis.  He had an I/D of the site on 03/07/2023.  Pt will remain on IV abx per MD's orders.      Problem: Clinical Measurements: Goal: Ability to maintain clinical measurements within normal limits will improve Outcome: Progressing Pt's slightly bradycardic this shift MD is aware.    Problem: Clinical Measurements: Goal: Will remain free from infection Outcome: Progressing S/Sx of infection monitored and assessed q8 hours.  Pt has remained afebrile thus far.  He is on IV abx per MD's orders.      Problem: Clinical Measurements: Goal: Respiratory complications will improve Outcome: Progressing Respiratory status monitored and assessed q8 hours.  Pt is on room air with PO2 saturations at 100% and respirations of 16 breaths per minute.  He has not endorsed c/o SOB and DOE.    Problem: Activity: Goal: Risk for activity intolerance will decrease Outcome: Progressing Pt is independent of all his ADLs. He does not need the assistance of RN staff to get OOB.  He was observed ambulating in his room with a steady gait.   Problem: Nutrition: Goal: Adequate nutrition will be maintained Outcome: Progressing Pt is on a regular diet per MD's orders.  He has been able to tolerate his diet without s/sx of n/v or abdominal pain/ distention.     Problem: Elimination: Goal: Will not experience complications related to bowel motility Outcome: Progressing Pt LBM was in 03/08/2023.  He has not endorse c/o constipation.    Problem:  Elimination: Goal: Will not experience complications related to urinary retention Outcome: Progressing Pt has denied c/o dysuria or abdominal distention/ pain.   Problem: Pain Management: Goal: General experience of comfort will improve Outcome: Progressing Pt has endorsed c/o 6-9/10 right hand pain describing it as a constant sharp, stabbing, throbbing pain.  Reiterated pain scale so he could adequately rate his pain.  Pt stated his pain goal this admission 0/10.  Discussed nonpharmacological methods to help reduce s/sx of pain.  Interventions given per pt's request and MD's orders.    Problem: Safety: Goal: Ability to remain free from injury will improve Outcome: Progressing Pt has remained free from falls thus far.  Instructed pt to utilize RN call light for assistance.  Hourly rounds performed.  Bed alarm implemented to keep pt safe from falls.  Settings activated to third most sensitive mode.  Bed in lowest position, locked with two upper side rails engaged.  Belongings and call light within reach.    Problem: Skin Integrity: Goal: Risk for impaired skin integrity will decrease Outcome: Progressing Skin integrity monitored and assessed q-shift. Pt is on q2 hourly turns to prevent further skin impairment.  Tubes and drains assessed for device related pressure sores.  Pt is continent of both bowel and bladder

## 2023-03-08 NOTE — Hospital Course (Signed)
Paul Ochoa is a 32 yo male with PMH asthma who presented with an ongoing for right hand infection.  He had been reported to have punched a tree on 02/17/2023 and was evaluated in the ER afterwards.  He was recommended for outpatient follow-up with orthopedic surgery but had not done so.  Due to worsening of his wound, he was ultimately referred to the ER for orthopedic evaluation. He underwent outpatient MRI and was noted to have osteomyelitis involving third metacarpal head with joint effusion and abscess extending into the palm. He was taken to the OR on 03/07/2023 by orthopedic surgery for further debridement. Infectious disease was consulted after surgery and culture results were reviewed (grew GBS at time of discharge, fungal cultures pending).  He was recommended for 6 weeks Augmentin postop.  He will follow-up outpatient with orthopedic surgery and infectious disease as well.

## 2023-03-08 NOTE — TOC Progression Note (Addendum)
Transition of Care [facility]) - Progression Note    Patient Details  Name: Paul Ochoa MRN: [id number] Date of Birth: 03-08-1991  [facility] CM/SW Contact  Ronny Bacon, RN Phone Number: 03/08/2023, 11:48 AM  Clinical Narrative:  Patient concerned about affording prescriptions at discharge. Patient currently on IV antibiotics, waiting ID input on next steps for antibiotic route at discharge. Patient confirmed medicaid coverage and ability to afford $4 prescriptions. Unable to obtain [facility] due to insurance coverage not accepted with [facility] agencies, [place] and [place] attempts made.         Expected Discharge Plan and Services                                               Social Determinants of Health (SDOH) Interventions SDOH Screenings   Food Insecurity: Food Insecurity Present (03/07/2023)  Housing: Low Risk  (03/07/2023)  Transportation Needs: No Transportation Needs (03/07/2023)  Utilities: Not At Risk (03/07/2023)  Tobacco Use: High Risk (03/07/2023)    Readmission Risk Interventions     No data to display

## 2023-03-08 NOTE — Progress Notes (Signed)
Progress Note    Paul Ochoa   NUU:[id number]  DOB: 01-19-1991  DOA: 03/06/2023     1 PCP: Pcp, No  Initial CC: right hand wound   Hospital Course: Mr. Paul Ochoa is a 32 yo male with PMH asthma who presented with an ongoing for right hand infection.  He had been reported to have punched a tree on 02/17/2023 and was evaluated in the ER afterwards.  He was recommended for outpatient follow-up with orthopedic surgery but had not done so.  Due to worsening of his wound, he was ultimately referred to the ER for orthopedic evaluation. He underwent outpatient MRI and was noted to have osteomyelitis involving third metacarpal head with joint effusion and abscess extending into the palm. He was taken to the OR on 03/07/2023 by orthopedic surgery for further debridement.  Interval History:  Resting in bed when seen today.  Expected pain/discomfort in his hand but tolerating fairly well. Due for dressing change today.  Assessment and Plan: * Infection of right hand - injured after punching a tree approx end of July; wound worsened with care at home - per ortho, outpatient MRI suggested OM of 3rd MC head and septic arthritis and abscess -Underwent debridement of third metacarpal head for osteomyelitis and I&D of palmar abscess; MCP joint irrigated for septic arthritis as well -Remains on vancomycin and Zosyn - ID consulted for further antibiotic guidance -Cultures obtained in the OR.  Antibiotics started afterwards.  Currently wound culture growing group B strep -Continue wound care per orthopedic recommendations  Asthma, chronic - Continue PRN albuterol   Old records reviewed in assessment of this patient  Antimicrobials: Vancomycin 03/07/2023 >> current Zosyn 03/07/2023 >> current  DVT prophylaxis:  SCDs Start: 03/07/23 0256   Code Status:   Code Status: Full Code  Mobility Assessment (Last 72 Hours)     Mobility Assessment     Row Name 03/08/23 3474 03/07/23 2020 03/07/23  1046 03/07/23 0946 03/07/23 0222   Does patient have an order for bedrest or is patient medically unstable No - Continue assessment No - Continue assessment -- No - Continue assessment No - Continue assessment   What is the highest level of mobility based on the progressive mobility assessment? Level 6 (Walks independently in room and hall) - Balance while walking in room without assist - Complete Level 6 (Walks independently in room and hall) - Balance while walking in room without assist - Complete -- Level 6 (Walks independently in room and hall) - Balance while walking in room without assist - Complete Level 6 (Walks independently in room and hall) - Balance while walking in room without assist - Complete            Barriers to discharge: none Disposition Plan:  Home Status is: Inpt  Objective: Blood pressure 119/67, pulse 66, temperature 98.2 F (36.8 C), temperature source Oral, resp. rate 16, height 5' 7.01" (1.702 m), weight 83.9 kg, SpO2 100%.  Examination:  Physical Exam Constitutional:      General: He is not in acute distress.    Appearance: Normal appearance.  HENT:     Head: Normocephalic and atraumatic.     Mouth/Throat:     Mouth: Mucous membranes are moist.  Eyes:     Extraocular Movements: Extraocular movements intact.  Cardiovascular:     Rate and Rhythm: Normal rate and regular rhythm.  Pulmonary:     Effort: Pulmonary effort is normal. No respiratory distress.     Breath sounds:  Normal breath sounds. No wheezing.  Abdominal:     General: Bowel sounds are normal. There is no distension.     Palpations: Abdomen is soft.     Tenderness: There is no abdominal tenderness.  Musculoskeletal:        General: Normal range of motion.     Cervical back: Normal range of motion and neck supple.     Comments: Right hand wrapped in surgical bandage, minimal swelling in fingers; mild paresthesias in 3rd/4th distal fingers  Skin:    General: Skin is warm and dry.   Neurological:     General: No focal deficit present.     Mental Status: He is alert.  Psychiatric:        Mood and Affect: Mood normal.        Behavior: Behavior normal.      Consultants:  Orthopedic surgery ID  Procedures:  8/18: PROCEDURE:  Arthrotomy of MCP joint for irrigation and drainage of septic arthritis (40981) Debridement of third metacarpal head for osteomyelitis (opening of bone cortex for osteomyelitis, 19147) Irrigation and drainage of palmar abscess secondary to osteomyelitis, complicated (82956) Open release of middle finger A1 pulley (21308)  Data Reviewed: No results found for this or any previous visit (from the past 24 hour(s)).  I have reviewed pertinent nursing notes, vitals, labs, and images as necessary. I have ordered labwork to follow up on as indicated.  I have reviewed the last notes from staff over past 24 hours. I have discussed patient's care plan and test results with nursing staff, CM/SW, and other staff as appropriate.  Time spent: Greater than 50% of the 55 minute visit was spent in counseling/coordination of care for the patient as laid out in the A&P.   LOS: 1 day   Paul Chamber, MD [facility] 03/08/2023, 12:48 PM

## 2023-03-08 NOTE — Assessment & Plan Note (Signed)
Continue PRN albuterol.  

## 2023-03-08 NOTE — Progress Notes (Signed)
   Subjective:  Resting comfortably.  Pain well controlled.   Objective:   VITALS:   Vitals:   03/07/23 1937 03/08/23 0346 03/08/23 0716 03/08/23 1523  BP: 117/71 (!) 105/57 119/67 (!) 116/56  Pulse: 64 64 66 (!) 57  Resp: 16 16 16 16   Temp: 98.4 F (36.9 C) 98.4 F (36.9 C) 98.2 F (36.8 C) 98.2 F (36.8 C)  TempSrc:   Oral Oral  SpO2: 100% 97% 100% 100%  Weight:      Height:        Gen: NAD, resting comfortably Pulm: Normal WOB on RA CV: BUE wram and well perfused, normal rate Right Hand:  Dorsal and volar incisions are clean and dry.  Penrose drains removed.  No drainage.  Limited AROM of wrist and fingers secondary to pain.  SILT m/u/r distribution.  Hand warm and well perfused w/ BCR.     Lab Results  Component Value Date   WBC 9.7 03/06/2023   HGB 13.7 03/06/2023   HCT 40.5 03/06/2023   MCV 94.0 03/06/2023   PLT 311 03/06/2023     Assessment/Plan:  Pt POD 1 s/p I&D of right third MCP septic arthritis with acute osteomyelitis of the third metacarpal head and palmar abscess.   - Intra-op cultures growing rare Group B strep, ID consulted, appreciate their help w/ abx choice and duration - Wound care started today, will continue BID wound care tomorrow - Dispo pending final abx choice - I will see patient in the office one week after discharge   Marlyne Beards, MD 03/08/2023, 5:43 PM [phone]

## 2023-03-08 NOTE — Assessment & Plan Note (Addendum)
-   injured after punching a tree approx end of July; wound worsened with care at home - per ortho, outpatient MRI suggested OM of 3rd MC head and septic arthritis and abscess -Underwent debridement of third metacarpal head for osteomyelitis and I&D of palmar abscess; MCP joint irrigated for septic arthritis as well -treated with vancomycin and Zosyn in hospital - ID consulted for further antibiotic guidance -Cultures obtained in the OR.  Antibiotics started afterwards.  Currently wound culture growing group B strep; fungal cultures in process at discharge -Continue wound care per orthopedic recommendations; patient understands wound care recommendations and able to perform at home with help too - 6 week course Augmentin continued at discharge and patient has followups planned with ID and orthopedic surgery

## 2023-03-09 ENCOUNTER — Other Ambulatory Visit (HOSPITAL_COMMUNITY): Payer: Self-pay

## 2023-03-09 DIAGNOSIS — L02511 Cutaneous abscess of right hand: Secondary | ICD-10-CM | POA: Diagnosis not present

## 2023-03-09 DIAGNOSIS — M86141 Other acute osteomyelitis, right hand: Secondary | ICD-10-CM | POA: Diagnosis not present

## 2023-03-09 DIAGNOSIS — L089 Local infection of the skin and subcutaneous tissue, unspecified: Secondary | ICD-10-CM | POA: Diagnosis not present

## 2023-03-09 LAB — BASIC METABOLIC PANEL
Anion gap: 11 (ref 5–15)
BUN: 13 mg/dL (ref 6–20)
CO2: 25 mmol/L (ref 22–32)
Calcium: 8.4 mg/dL — ABNORMAL LOW (ref 8.9–10.3)
Chloride: 103 mmol/L (ref 98–111)
Creatinine, Ser: 1.2 mg/dL (ref 0.61–1.24)
GFR, Estimated: >60 mL/min (ref >60)
Glucose, Bld: 99 mg/dL (ref 70–99)
Potassium: 3.8 mmol/L (ref 3.5–5.1)
Sodium: 139 mmol/L (ref 135–145)

## 2023-03-09 MED ORDER — AMOXICILLIN-POT CLAVULANATE 875-125 MG PO TABS
1.0000 | ORAL_TABLET | Freq: Two times a day (BID) | ORAL | Status: DC
  Administered 2023-03-09: 1 via ORAL
  Filled 2023-03-09: qty 1

## 2023-03-09 MED ORDER — AMOXICILLIN-POT CLAVULANATE 875-125 MG PO TABS
1.0000 | ORAL_TABLET | Freq: Two times a day (BID) | ORAL | 0 refills | Status: AC
Start: 2023-03-09 — End: 2023-04-18
  Filled 2023-03-09: qty 12, 6d supply, fill #1
  Filled 2023-03-09: qty 68, 34d supply, fill #0

## 2023-03-09 MED ORDER — OXYCODONE HCL 5 MG PO TABS
5.0000 mg | ORAL_TABLET | ORAL | 0 refills | Status: AC | PRN
Start: 2023-03-09 — End: 2023-03-16
  Filled 2023-03-09: qty 25, 5d supply, fill #0

## 2023-03-09 NOTE — TOC Initial Note (Signed)
Transition of Care [facility]) - Initial/Assessment Note    Patient Details  Name: Paul Ochoa MRN: [id number] Date of Birth: Nov 17, 1990  [facility]) CM/SW Contact:    Lawerance Sabal, RN Phone Number: 03/09/2023, 9:46 AM  Clinical Narrative:                  Spoke w patient and significant other at bedside, they live together.  Patient's R hand still currently in surgical dressing, unable to visualize. SO confirms that she will be able to assist with dressing changes, patient agrees. No HH needs.   Expected Discharge Plan: Home/Self Care Barriers to Discharge: Continued Medical Work up   Patient Goals and CMS Choice Patient states their goals for this hospitalization and ongoing recovery are:: to g ohome          Expected Discharge Plan and Services   Discharge Planning Services: CM Consult Post Acute Care Choice: NA Living arrangements for the past 2 months: Single Family Home                 DME Arranged: N/A         HH Arranged: NA          Prior Living Arrangements/Services Living arrangements for the past 2 months: Single Family Home Lives with:: Significant Other                   Activities of Daily Living Home Assistive Devices/Equipment: None ADL Screening (condition at time of admission) Patient's cognitive ability adequate to safely complete daily activities?: Yes Is the patient deaf or have difficulty hearing?: No Does the patient have difficulty seeing, even when wearing glasses/contacts?: No Does the patient have difficulty concentrating, remembering, or making decisions?: No Patient able to express need for assistance with ADLs?: Yes Does the patient have difficulty dressing or bathing?: No Independently performs ADLs?: Yes (appropriate for developmental age) Does the patient have difficulty walking or climbing stairs?: No Weakness of Legs: None Weakness of Arms/Hands: Right  Permission Sought/Granted                   Emotional Assessment              Admission diagnosis:  Infection of hand [L08.9] Other acute osteomyelitis of right hand [facility]) [M86.141] Patient Active Problem List   Diagnosis Date Noted   Infection of right hand 03/07/2023   Asthma, chronic 03/07/2023   PCP:  Pcp, No Pharmacy:   [id number] - [place] ([place]), [place] - [address] [place] ([place]) [place] [address] Phone: [phone] Fax: [phone]  [id number] - [place] ([place]), [place] - [address] [place] ([place] [address] Phone: [phone] Fax: [phone]  [id number] - [place], [place] - [address] [place] [address] Phone: [phone] Fax: [phone]  [facility] [address] [place] [address] Phone: [phone] Fax: [phone]     Social Determinants of Health (SDOH) Social History: SDOH Screenings   Food Insecurity: Food Insecurity Present (03/07/2023)  Housing: Low Risk  (03/07/2023)  Transportation Needs: No Transportation Needs (03/07/2023)  Utilities: Not At Risk (03/07/2023)  Tobacco Use: High Risk (03/07/2023)   SDOH Interventions:     Readmission Risk Interventions     No data to display

## 2023-03-09 NOTE — Consult Note (Signed)
[facility]    Date of Admission:  03/06/2023   Total days of inpatient antibiotics 2        Reason for Consult: Right hand infection    Principal Problem:   Infection of right hand Active Problems:   Asthma, chronic   Assessment: 32 year old male who punched a tree about 3 weeks ago presents for right hand infection: #Right hand osteomyelitis/abscess status post I&D -Per Ortho note, stat MRI was obtained on admission which showed large joint effusion consistent with septic arthritis, change in metacarpal heads consistent with osteomyelitis, abscess originating at joint capsule.  MRI is listed as no-show on acute of EMR on my end. - Taken to the OR with orthopedics on 8/18 with bone softening and purulence encountered.  Or cultures grew group B strep.   - I spoke to patient although he had a couple ED visit, denies being on antibiotics prior to hospitalization.  Recommendations:  -Discontinue vancomycin and pip-tazo - Start Augmentin 875/125 p.o. twice daily to complete 6 weeks of antibiotics from the OR on 8/18 EOT9/28. - Follow-up or cultures including bacterial AFB and fungal - Follow-up with infectious disease on 9/3 with myself - ID will sign off Microbiology:   Antibiotics: Vancomycin and pip-tazo 8/18-present  Cultures: Blood  Urine  Other 8/18 2/2 group B strep, AFB and fungal cultures pending  HPI: Paul Ochoa is a 32 y.o. male with history of asthma/bronchitis admitted with right hand infection.  Patient punched a tree approximately 3 weeks ago.  Notes that he had a couple ED visit, was not given antibiotics for referral to orthopedics.  On arrival MRI showed large orange effusion consistent with septic arthritis and signature on the metacarpal head noted with acute osteomyelitis, capsular abscess.  Taken to the OR with orthopedics on 8/18 for debridement OR noted some softening of bone and metacarpal head.  S purulence  encountered.  ID engaged for antibiotic recommendations.   Review of Systems: Review of Systems  All other systems reviewed and are negative.   Past Medical History:  Diagnosis Date   Asthma    Bronchitis     Social History   Tobacco Use   Smoking status: Every Day    Current packs/day: 1.00    Types: Cigarettes  Substance Use Topics   Alcohol use: No   Drug use: Yes    Types: Marijuana    Comment: last joint 1 week ago    Family History  Family history unknown: Yes   Scheduled Meds:  amoxicillin-clavulanate  1 tablet Oral Q12H   chlorhexidine   Topical BID   ketorolac  30 mg Intravenous Q6H   Continuous Infusions: PRN Meds:.albuterol, HYDROmorphone (DILAUDID) injection, naLOXone (NARCAN)  injection, oxyCODONE Allergies  Allergen Reactions   Coconut (Cocos Nucifera) Anaphylaxis    Mouth swelling    OBJECTIVE: Blood pressure 127/75, pulse (!) 57, temperature 98.5 F (36.9 C), resp. rate 18, height 5' 7.01" (1.702 m), weight 83.9 kg, SpO2 100%.  Physical Exam Constitutional:      General: He is not in acute distress.    Appearance: He is normal weight. He is not toxic-appearing.  HENT:     Head: Normocephalic and atraumatic.     Right Ear: External ear normal.     Left Ear: External ear normal.     Nose: No congestion or rhinorrhea.     Mouth/Throat:     Mouth: Mucous membranes are moist.  Pharynx: Oropharynx is clear.  Eyes:     Extraocular Movements: Extraocular movements intact.     Conjunctiva/sclera: Conjunctivae normal.     Pupils: Pupils are equal, round, and reactive to light.  Cardiovascular:     Rate and Rhythm: Normal rate and regular rhythm.     Heart sounds: No murmur heard.    No friction rub. No gallop.  Pulmonary:     Effort: Pulmonary effort is normal.     Breath sounds: Normal breath sounds.  Abdominal:     General: Abdomen is flat. Bowel sounds are normal.     Palpations: Abdomen is soft.  Musculoskeletal:        General:  No swelling.     Cervical back: Normal range of motion and neck supple.     Comments: Right hand banaged  Skin:    General: Skin is warm and dry.  Neurological:     General: No focal deficit present.     Mental Status: He is oriented to person, place, and time.  Psychiatric:        Mood and Affect: Mood normal.     Lab Results Lab Results  Component Value Date   WBC 9.7 03/06/2023   HGB 13.7 03/06/2023   HCT 40.5 03/06/2023   MCV 94.0 03/06/2023   PLT 311 03/06/2023    Lab Results  Component Value Date   CREATININE 1.20 03/09/2023   BUN 13 03/09/2023   NA 139 03/09/2023   K 3.8 03/09/2023   CL 103 03/09/2023   CO2 25 03/09/2023    Lab Results  Component Value Date   ALT 24 01/15/2014   AST 32 01/15/2014   ALKPHOS 53 01/15/2014   BILITOT 0.5 01/15/2014       Danelle Earthly, MD [facility] 03/09/2023, 11:24 AM   I have personally spent 84 minutes involved in face-to-face and non-face-to-face activities for this patient on the day of the visit. Professional time spent includes the following activities: Preparing to see the patient (review of tests), Obtaining and/or reviewing separately obtained history (admission/discharge record), Performing a medically appropriate examination and/or evaluation , Ordering medications/tests/procedures, referring and communicating with other health care professionals, Documenting clinical information in the EMR, Independently interpreting results (not separately reported), Communicating results to the patient/family/caregiver, Counseling and educating the patient/family/caregiver and Care coordination (not separately reported).

## 2023-03-09 NOTE — Plan of Care (Signed)
Patient alert/oriented X4. Patient compliant with medication administration and dressing changed on R hand. Patient sent home with home wound care supplies for R hand and PIV removed prior to discharge. AVS instructions explained in detail to patient. Patient belongings are packed up at bedside. VSS upon discharge.   Problem: Education: Goal: Knowledge of General Education information will improve Description: Including pain rating scale, medication(s)/side effects and non-pharmacologic comfort measures Outcome: Adequate for Discharge   Problem: Health Behavior/Discharge Planning: Goal: Ability to manage health-related needs will improve Outcome: Adequate for Discharge   Problem: Clinical Measurements: Goal: Ability to maintain clinical measurements within normal limits will improve Outcome: Adequate for Discharge   Problem: Clinical Measurements: Goal: Will remain free from infection Outcome: Adequate for Discharge   Problem: Clinical Measurements: Goal: Diagnostic test results will improve Outcome: Adequate for Discharge   Problem: Clinical Measurements: Goal: Respiratory complications will improve Outcome: Adequate for Discharge   Problem: Clinical Measurements: Goal: Cardiovascular complication will be avoided Outcome: Adequate for Discharge   Problem: Activity: Goal: Risk for activity intolerance will decrease Outcome: Adequate for Discharge   Problem: Nutrition: Goal: Adequate nutrition will be maintained Outcome: Adequate for Discharge   Problem: Elimination: Goal: Will not experience complications related to bowel motility Outcome: Adequate for Discharge   Problem: Elimination: Goal: Will not experience complications related to urinary retention Outcome: Adequate for Discharge   Problem: Pain Managment: Goal: General experience of comfort will improve Outcome: Adequate for Discharge   Problem: Safety: Goal: Ability to remain free from injury will  improve Outcome: Adequate for Discharge   Problem: Skin Integrity: Goal: Risk for impaired skin integrity will decrease Outcome: Adequate for Discharge

## 2023-03-09 NOTE — Discharge Summary (Signed)
Physician Discharge Summary   Paul Ochoa EXB:[id number] DOB: 04-30-1991 DOA: 03/06/2023  PCP: Oneita Hurt, No  Admit date: 03/06/2023 Discharge date: 03/09/2023   Admitted From: Home Disposition:  Home Discharging physician: Lewie Chamber, MD Barriers to discharge: none  Recommendations at discharge: Follow up with ID and orthopedic surgery  Discharge Condition: stable CODE STATUS: Full Diet recommendation:  Diet Orders (From admission, onward)     Start     Ordered   03/09/23 0000  Diet general        03/09/23 1036   03/07/23 1650  Diet regular Room service appropriate? Yes; Fluid consistency: Thin  Diet effective now       Question Answer Comment  Room service appropriate? Yes   Fluid consistency: Thin      03/07/23 1650            Hospital Course: Mr. Terhaar is a 32 yo male with PMH asthma who presented with an ongoing for right hand infection.  He had been reported to have punched a tree on 02/17/2023 and was evaluated in the ER afterwards.  He was recommended for outpatient follow-up with orthopedic surgery but had not done so.  Due to worsening of his wound, he was ultimately referred to the ER for orthopedic evaluation. He underwent outpatient MRI and was noted to have osteomyelitis involving third metacarpal head with joint effusion and abscess extending into the palm. He was taken to the OR on 03/07/2023 by orthopedic surgery for further debridement. Infectious disease was consulted after surgery and culture results were reviewed (grew GBS at time of discharge, fungal cultures pending).  He was recommended for 6 weeks Augmentin postop.  He will follow-up outpatient with orthopedic surgery and infectious disease as well.  Assessment and Plan: * Infection of right hand - injured after punching a tree approx end of July; wound worsened with care at home - per ortho, outpatient MRI suggested OM of 3rd MC head and septic arthritis and abscess -Underwent debridement of  third metacarpal head for osteomyelitis and I&D of palmar abscess; MCP joint irrigated for septic arthritis as well -treated with vancomycin and Zosyn in hospital - ID consulted for further antibiotic guidance -Cultures obtained in the OR.  Antibiotics started afterwards.  Currently wound culture growing group B strep; fungal cultures in process at discharge -Continue wound care per orthopedic recommendations; patient understands wound care recommendations and able to perform at home with help too - 6 week course Augmentin continued at discharge and patient has followups planned with ID and orthopedic surgery   Asthma, chronic - PRN albuterol   The patient's acute and chronic medical conditions were treated accordingly. On day of discharge, patient was felt deemed stable for discharge. Patient/family member advised to call PCP or come back to ER if needed.   Principal Diagnosis: Infection of right hand  Discharge Diagnoses: Active Hospital Problems   Diagnosis Date Noted   Infection of right hand 03/07/2023    Priority: 1.   Asthma, chronic 03/07/2023    Resolved Hospital Problems  No resolved problems to display.     Discharge Instructions     Diet general   Complete by: As directed    Discharge wound care:   Complete by: As directed    Soak operative hand in warm, diluted Hibiclens solution for 10 minutes.  Fill basin with warm, bath temperature water.  Add two 15 mL packet to water or three heavy squirts from bottle (~ 2 tablespoons).  Soak hand  for 10 minutes and encourage patient to move fingers around in water.  Pat dry and apply a clean, dry dressing.   Increase activity slowly   Complete by: As directed       Allergies as of 03/09/2023       Reactions   Coconut (cocos Nucifera) Anaphylaxis   Mouth swelling        Medication List     TAKE these medications    acetaminophen 500 MG tablet Commonly known as: TYLENOL Take 1,000 mg by mouth every 6 (six) hours  as needed for mild pain.   albuterol 108 (90 Base) MCG/ACT inhaler Commonly known as: VENTOLIN HFA Inhale 2 puffs into the lungs every 6 (six) hours as needed. For shortness of breath   amoxicillin-clavulanate 875-125 MG tablet Commonly known as: AUGMENTIN Take 1 tablet by mouth 2 (two) times daily.   oxyCODONE 5 MG immediate release tablet Commonly known as: Roxicodone Take 1 tablet (5 mg total) by mouth every 4 (four) hours as needed for up to 7 days.               Discharge Care Instructions  (From admission, onward)           Start     Ordered   03/09/23 0000  Discharge wound care:       Comments: Soak operative hand in warm, diluted Hibiclens solution for 10 minutes.  Fill basin with warm, bath temperature water.  Add two 15 mL packet to water or three heavy squirts from bottle (~ 2 tablespoons).  Soak hand for 10 minutes and encourage patient to move fingers around in water.  Pat dry and apply a clean, dry dressing.   03/09/23 1036            Follow-up Information     Marlyne Beards, MD. Schedule an appointment as soon as possible for a visit in 1 week(s).   Specialty: Orthopedic Surgery Contact information: [address] [place] [address] [phone]                Allergies  Allergen Reactions   Coconut (Cocos Nucifera) Anaphylaxis    Mouth swelling    Consultations: ID Orthopedic surgery  Procedures: 8/18: PROCEDURE:  Arthrotomy of MCP joint for irrigation and drainage of septic arthritis (26075) Debridement of third metacarpal head for osteomyelitis (opening of bone cortex for osteomyelitis, 26034) Irrigation and drainage of palmar abscess secondary to osteomyelitis, complicated (20005) Open release of middle finger A1 pulley (16073)  Discharge Exam: BP 127/75   Pulse (!) 57   Temp 98.5 F (36.9 C)   Resp 18   Ht 5' 7.01" (1.702 m)   Wt 83.9 kg   SpO2 100%   BMI 28.97 kg/m  Physical  Exam Constitutional:      General: He is not in acute distress.    Appearance: Normal appearance.  HENT:     Head: Normocephalic and atraumatic.     Mouth/Throat:     Mouth: Mucous membranes are moist.  Eyes:     Extraocular Movements: Extraocular movements intact.  Cardiovascular:     Rate and Rhythm: Normal rate and regular rhythm.  Pulmonary:     Effort: Pulmonary effort is normal. No respiratory distress.     Breath sounds: Normal breath sounds. No wheezing.  Abdominal:     General: Bowel sounds are normal. There is no distension.     Palpations: Abdomen is soft.     Tenderness: There  is no abdominal tenderness.  Musculoskeletal:        General: Normal range of motion.     Cervical back: Normal range of motion and neck supple.     Comments: Right hand wrapped in surgical bandage, minimal swelling in fingers; mild paresthesias in 3rd/4th distal fingers  Skin:    General: Skin is warm and dry.  Neurological:     General: No focal deficit present.     Mental Status: He is alert.  Psychiatric:        Mood and Affect: Mood normal.        Behavior: Behavior normal.      The results of significant diagnostics from this hospitalization (including imaging, microbiology, ancillary and laboratory) are listed below for reference.   Microbiology: Recent Results (from the past 240 hour(s))  Culture, blood (single) w Reflex to ID Panel     Status: None (Preliminary result)   Collection Time: 03/06/23 11:51 PM   Specimen: BLOOD LEFT ARM  Result Value Ref Range Status   Specimen Description BLOOD LEFT ARM  Final   Special Requests   Final    BOTTLES DRAWN AEROBIC AND ANAEROBIC Blood Culture adequate volume   Culture   Final    NO GROWTH 2 DAYS Performed at [facility], [address] [place], [place] [address]    Report Status PENDING  Incomplete  Aerobic/Anaerobic Culture w Gram Stain (surgical/deep wound)     Status: None (Preliminary result)   Collection Time: 03/07/23  12:30 PM   Specimen: Path fluid; Body Fluid  Result Value Ref Range Status   Specimen Description ABSCESS  Final   Special Requests right hand  Final   Gram Stain   Final    FEW WBC PRESENT,BOTH PMN AND MONONUCLEAR NO ORGANISMS SEEN Performed at [facility], [address] [place], [place] [address]    Culture   Final    RARE GROUP B STREP(S.AGALACTIAE)ISOLATED TESTING AGAINST S. AGALACTIAE NOT ROUTINELY PERFORMED DUE TO PREDICTABILITY OF AMP/PEN/VAN SUSCEPTIBILITY. NO ANAEROBES ISOLATED; CULTURE IN PROGRESS FOR 5 DAYS    Report Status PENDING  Incomplete  Aerobic/Anaerobic Culture w Gram Stain (surgical/deep wound)     Status: None (Preliminary result)   Collection Time: 03/07/23 12:39 PM   Specimen: Path Tissue  Result Value Ref Range Status   Specimen Description TISSUE  Final   Special Requests right MCP synovium  Final   Gram Stain   Final    MODERATE WBC PRESENT,BOTH PMN AND MONONUCLEAR NO ORGANISMS SEEN Performed at [facility], [address] [place], [place] [address]    Culture   Final    RARE GROUP B STREP(S.AGALACTIAE)ISOLATED TESTING AGAINST S. AGALACTIAE NOT ROUTINELY PERFORMED DUE TO PREDICTABILITY OF AMP/PEN/VAN SUSCEPTIBILITY. NO ANAEROBES ISOLATED; CULTURE IN PROGRESS FOR 5 DAYS    Report Status PENDING  Incomplete     Labs: BNP (last 3 results) No results for input(s): "BNP" in the last 8760 hours. Basic Metabolic Panel: Recent Labs  Lab 03/06/23 2351 03/09/23 0929  NA 140 139  K 4.0 3.8  CL 103 103  CO2 25 25  GLUCOSE 109* 99  BUN 8 13  CREATININE 1.18 1.20  CALCIUM 9.0 8.4*   Liver Function Tests: No results for input(s): "AST", "ALT", "ALKPHOS", "BILITOT", "PROT", "ALBUMIN" in the last 168 hours. No results for input(s): "LIPASE", "AMYLASE" in the last 168 hours. No results for input(s): "AMMONIA" in the last 168 hours. CBC: Recent Labs  Lab 03/06/23 2351  WBC 9.7  NEUTROABS 6.0  HGB 13.7  HCT 40.5  MCV 94.0  PLT  311   Cardiac Enzymes: No results for input(s): "CKTOTAL", "CKMB", "CKMBINDEX", "TROPONINI" in the last 168 hours. BNP: Invalid input(s): "POCBNP" CBG: No results for input(s): "GLUCAP" in the last 168 hours. D-Dimer No results for input(s): "DDIMER" in the last 72 hours. Hgb A1c No results for input(s): "HGBA1C" in the last 72 hours. Lipid Profile No results for input(s): "CHOL", "HDL", "LDLCALC", "TRIG", "CHOLHDL", "LDLDIRECT" in the last 72 hours. Thyroid function studies No results for input(s): "TSH", "T4TOTAL", "T3FREE", "THYROIDAB" in the last 72 hours.  Invalid input(s): "FREET3" Anemia work up No results for input(s): "VITAMINB12", "FOLATE", "FERRITIN", "TIBC", "IRON", "RETICCTPCT" in the last 72 hours. Urinalysis    Component Value Date/Time   COLORURINE YELLOW 05/30/2012 2057   APPEARANCEUR CLEAR 05/30/2012 2057   LABSPEC 1.041 (H) 05/30/2012 2057   PHURINE 6.0 05/30/2012 2057   GLUCOSEU NEGATIVE 05/30/2012 2057   HGBUR NEGATIVE 05/30/2012 2057   BILIRUBINUR NEGATIVE 05/30/2012 2057   KETONESUR TRACE (A) 05/30/2012 2057   PROTEINUR NEGATIVE 05/30/2012 2057   UROBILINOGEN 0.2 05/30/2012 2057   NITRITE NEGATIVE 05/30/2012 2057   LEUKOCYTESUR NEGATIVE 05/30/2012 2057   Sepsis Labs Recent Labs  Lab 03/06/23 2351  WBC 9.7   Microbiology Recent Results (from the past 240 hour(s))  Culture, blood (single) w Reflex to ID Panel     Status: None (Preliminary result)   Collection Time: 03/06/23 11:51 PM   Specimen: BLOOD LEFT ARM  Result Value Ref Range Status   Specimen Description BLOOD LEFT ARM  Final   Special Requests   Final    BOTTLES DRAWN AEROBIC AND ANAEROBIC Blood Culture adequate volume   Culture   Final    NO GROWTH 2 DAYS Performed at [facility], [address] [place], [place] [address]    Report Status PENDING  Incomplete  Aerobic/Anaerobic Culture w Gram Stain (surgical/deep wound)     Status: None (Preliminary result)   Collection  Time: 03/07/23 12:30 PM   Specimen: Path fluid; Body Fluid  Result Value Ref Range Status   Specimen Description ABSCESS  Final   Special Requests right hand  Final   Gram Stain   Final    FEW WBC PRESENT,BOTH PMN AND MONONUCLEAR NO ORGANISMS SEEN Performed at [facility], [address], [place], [place] [address]    Culture   Final    RARE GROUP B STREP(S.AGALACTIAE)ISOLATED TESTING AGAINST S. AGALACTIAE NOT ROUTINELY PERFORMED DUE TO PREDICTABILITY OF AMP/PEN/VAN SUSCEPTIBILITY. NO ANAEROBES ISOLATED; CULTURE IN PROGRESS FOR 5 DAYS    Report Status PENDING  Incomplete  Aerobic/Anaerobic Culture w Gram Stain (surgical/deep wound)     Status: None (Preliminary result)   Collection Time: 03/07/23 12:39 PM   Specimen: Path Tissue  Result Value Ref Range Status   Specimen Description TISSUE  Final   Special Requests right MCP synovium  Final   Gram Stain   Final    MODERATE WBC PRESENT,BOTH PMN AND MONONUCLEAR NO ORGANISMS SEEN Performed at [facility], [address], [place], [place] [address]    Culture   Final    RARE GROUP B STREP(S.AGALACTIAE)ISOLATED TESTING AGAINST S. AGALACTIAE NOT ROUTINELY PERFORMED DUE TO PREDICTABILITY OF AMP/PEN/VAN SUSCEPTIBILITY. NO ANAEROBES ISOLATED; CULTURE IN PROGRESS FOR 5 DAYS    Report Status PENDING  Incomplete    Procedures/Studies: DG Hand Complete Right  Result Date: 02/17/2023  CLINICAL DATA:  Pain and swelling. Punched a tree with right hand last week. Patient reports hurting the same hand in 2015 and having pins placed. EXAM: RIGHT HAND - COMPLETE 3+ VIEW COMPARISON:  Right hand radiographs 02/15/2023 and 01/15/2014 FINDINGS: Neutral ulnar variance. Normal bone mineralization. Joint spaces are preserved. No acute fracture is seen. No healing fracture line sclerosis. There is again mild soft tissue swelling at the dorsal aspect of the metacarpals. IMPRESSION: 1. No acute or healing fracture is seen. 2. Mild soft tissue  swelling at the dorsal aspect of the metacarpals. Electronically Signed   By: Neita Garnet M.D.   On: 02/17/2023 09:28     Time coordinating discharge: Over 30 minutes    Lewie Chamber, MD  [facility] 03/09/2023, 1:49 PM

## 2023-03-10 ENCOUNTER — Telehealth: Payer: Self-pay

## 2023-03-10 NOTE — Transitions of Care (Post Inpatient/ED Visit) (Signed)
   03/10/2023  Name: Paul Ochoa MRN: [id number] DOB: 1991-03-19  Created in error.

## 2023-03-10 NOTE — Transitions of Care (Post Inpatient/ED Visit) (Signed)
03/10/2023  Name: Paul Ochoa MRN: [id number] DOB: [birth year]  Today's TOC FU Call Status: Today's TOC FU Call Status:: Successful TOC FU Call Completed TOC FU Call Complete Date: 03/10/23  Transition Care Management Follow-up Telephone Call Date of Discharge: 03/09/23 Type of Discharge: Inpatient Admission Primary Inpatient Discharge Diagnosis:: "Acute Osteomyelitis of the right hand" How have you been since you were released from the hospital?: Better Any questions or concerns?: No  Items Reviewed: Did you receive and understand the discharge instructions provided?: Yes (Reviewd post discharge wound care instructions, medications incuding antibiotic and follow-up care.) Medications obtained,verified, and reconciled?: Yes (Medications Reviewed) Any new allergies since your discharge?: No Dietary orders reviewed?: NA Do you have support at home?: Yes People in Home: significant other Name of Support/Comfort Primary Source: resides with girlfriend who can assist if needed.  Medications Reviewed Today: Medications Reviewed Today     Reviewed by Amada Kingfisher, RN (Registered Nurse) on 03/10/23 at 1208  Med List Status: <None>   Medication Order Taking? Sig Documenting Provider Last Dose Status Informant  acetaminophen (TYLENOL) 500 MG tablet [id number] Yes Take 1,000 mg by mouth every 6 (six) hours as needed for mild pain. [provider] Taking Active   albuterol (PROVENTIL HFA;VENTOLIN HFA) 108 (90 BASE) MCG/ACT inhaler [id number] Yes Inhale 2 puffs into the lungs every 6 (six) hours as needed. For shortness of breath [provider] Taking Active Self, Pharmacy Records           Med Note Phebe Colla Mar 08, 2023 10:40 AM) Has used in the past but does not currently have one on hand.  amoxicillin-clavulanate (AUGMENTIN) 875-125 MG tablet [id number] Yes Take 1 tablet by mouth 2 (two) times daily. Danelle Earthly, MD Taking Active   oxyCODONE  (ROXICODONE) 5 MG immediate release tablet [id number] Yes Take 1 tablet (5 mg total) by mouth every 4 (four) hours as needed for up to 7 days. Lewie Chamber, MD Taking Active             Home Care and Equipment/Supplies: Were Home Health Services Ordered?: No Any new equipment or medical supplies ordered?: Yes (Dressing supplies,) Were you able to get the equipment/medical supplies?: Yes (Has dressing supplies except Hibiclens solution for warm soaks.  Patient contacting orthopedic office regarding discharge instructions for right hand warm soaks with Hibiclens solution.) Do you have any questions related to the use of the equipment/supplies?: No  Functional Questionnaire: Do you need assistance with bathing/showering or dressing?: No Do you need assistance with meal preparation?: No Do you need assistance with eating?: No Do you have difficulty maintaining continence: No Do you need assistance with getting out of bed/getting out of a chair/moving?: No Do you have difficulty managing or taking your medications?: No  Follow up appointments reviewed: Specialist Hospital Follow-up appointment confirmed?: No Reason Specialist Follow-Up Not Confirmed: Patient has Specialist Provider Number and will Call for Appointment Do you need transportation to your follow-up appointment?: No Do you understand care options if your condition(s) worsen?: Yes-patient verbalized understanding   Plan of care and self management goals reviewed:  Patient to contact orthopedic office today 03/10/23 to schedule 1 week follow-up.   Review of DC instructions.  Continue wound care soaks and dressing changes as prescribed.    Patient contacting ortho office today 03/10/23 for Hibiclens solution.     Wound care instruction per discharge summary as follows: Soak operative hand in warm, diluted Hibiclens solution for 10 minutes.  Fill basin with warm, bath temperature water.  Add two 15 mL packet to water or three  heavy squirts from bottle (~ 2 tablespoons).  Soak hand for 10 minutes and encourage patient to move fingers around in water.  Pat dry and apply a clean, dry dressing.  Education provided on s/s of worsening infection and when to call physician.  Contact ortho surgery or primary care provider with any questions or concerns.   Patient to contact ID for follow-up appointment and schedule for a date in September.  Next RNCM 30 day program transition of care visit scheduled for 8//28 at 1 pm.     Dodd Schmid J. Cristela Felt, RN, BSN, MSN Care Management Coordinator/[place] Phone Number:  [phone]

## 2023-03-12 ENCOUNTER — Other Ambulatory Visit: Payer: Self-pay | Admitting: Oncology

## 2023-03-12 DIAGNOSIS — Z006 Encounter for examination for normal comparison and control in clinical research program: Secondary | ICD-10-CM

## 2023-03-12 LAB — AEROBIC/ANAEROBIC CULTURE W GRAM STAIN (SURGICAL/DEEP WOUND)

## 2023-03-12 LAB — CULTURE, BLOOD (SINGLE)
Culture: NO GROWTH
Special Requests: ADEQUATE

## 2023-03-14 ENCOUNTER — Ambulatory Visit (HOSPITAL_COMMUNITY)
Admission: RE | Admit: 2023-03-14 | Discharge: 2023-03-14 | Disposition: A | Discharge status: Home / Self Care | Payer: Medicaid Other | Source: Ambulatory Visit | Attending: Orthopedic Surgery | Admitting: Orthopedic Surgery

## 2023-03-14 DIAGNOSIS — M79641 Pain in right hand: Secondary | ICD-10-CM | POA: Diagnosis not present

## 2023-03-14 DIAGNOSIS — M86141 Other acute osteomyelitis, right hand: Secondary | ICD-10-CM | POA: Diagnosis not present

## 2023-03-14 DIAGNOSIS — M65841 Other synovitis and tenosynovitis, right hand: Secondary | ICD-10-CM | POA: Diagnosis not present

## 2023-03-14 DIAGNOSIS — R609 Edema, unspecified: Secondary | ICD-10-CM | POA: Diagnosis not present

## 2023-03-14 DIAGNOSIS — M129 Arthropathy, unspecified: Secondary | ICD-10-CM | POA: Diagnosis not present

## 2023-03-17 ENCOUNTER — Telehealth: Payer: Self-pay

## 2023-03-17 ENCOUNTER — Other Ambulatory Visit: Payer: Medicaid Other

## 2023-03-17 NOTE — Patient Instructions (Signed)
Visit Information  Thank you for taking time to visit with me today. Please don't hesitate to contact me if I can be of assistance to you before our next scheduled telephone appointment.  Our next appointment is by telephone on Wednesday, 03/24/2023 at 10 am  Following is a copy of your care plan:   Goals Addressed             This Visit's Progress    Transition of Care       Current Barriers:  Knowledge Deficits related to plan of care for management of right hand infection.    RNCM Clinical Goal(s):  Patient will verbalize understanding of plan for management of right hand infection  as evidenced by verbalization of understanding of wound care management  through collaboration with RN Care manager, provider, and care team.   Patient will attend scheduled appointments with specialist.    Interventions: Evaluation of current treatment plan related to  self management and patient's adherence to plan as established by specialists. - complete 8/21 Educate patient on wound care instructions - completed 8/21. Educate patient on  s/s of infection and when to call physician - completed 8/21 Educate on importance of taking antibiotic as prescribed and completing full prescribed course of antibiotic treatment.  Reports attending follow-up appointment with hand specialist on Monday, 8/28 and states hand is healing well.  Denies pain.  Reports limited range of motion with one of his fingers and specialist will make referral for outpatient PT.   Has a scheduled follow-up appointment with infectious disease specialist on 03/22/3033.   Discuss with patient no primary care physician on record. And assist in finding PCP and scheduling visit.    Patient Goals/Self-Care Activities: Patient will complete dressing changes as prescribed.and verbally teach back.   Patient will notify providers of complications associated with s/s of worsening infection. Patient will take full course of antibiotic as  prescribed by provider.  Reports he continues to take antibiotics as prescribed and is following up with infectious disease specialist on 03/23/2023.  Primary Care Provider identified and visit           It was a pleasure speaking with you today,  please feel free to contact me prior to our next scheduled visit with any questions or concerns.    Telephone follow up appointment with care management team member scheduled for: Wednesday, 03/24/2023  Please call the care guide team at [phone] if you need to cancel or reschedule your appointment.   Please call the Suicide and Crisis Lifeline: 988 if you are experiencing a Mental Health or Behavioral Health Crisis or need someone to talk to.  Marquay Kruse J. Cristela Felt, RN, BSN, MSN Care Management Coordinator/[place] Phone Number:  [phone]

## 2023-03-17 NOTE — Patient Outreach (Signed)
  Care Management  Transitions of Care Program Transitions of Care Post-discharge week 2   03/17/2023 Name: Paul Ochoa MRN: [id number] DOB: [birth year]  Subjective: Paul Ochoa is a 32 y.o. year old male who is a primary care patient of Pcp, No. The Care Management team Engaged with patient Engaged with patient by telephone to assess and address transitions of care needs.   Consent to Services:  Patient was given information about care management services, agreed to services, and gave verbal consent to participate.   Assessment:           SDOH Interventions    Flowsheet Row Telephone from 03/17/2023 in [place] POPULATION HEALTH DEPARTMENT  SDOH Interventions   Housing Interventions Patient Declined  Utilities Interventions Patient Declined     Please reach out to your case manager at [phone] if assistance needed with community resources for housing, utilities, etc.    Goals Addressed             This Visit's Progress    Transition of Care       Current Barriers:  Knowledge Deficits related to plan of care for management of right hand infection.    RNCM Clinical Goal(s):  Patient will verbalize understanding of plan for management of right hand infection  as evidenced by verbalization of understanding of wound care management  through collaboration with RN Care manager, provider, and care team.   Patient will attend scheduled appointments with specialist.    Interventions: Evaluation of current treatment plan related to  self management and patient's adherence to plan as established by specialists. - complete 8/21 Educate patient on wound care instructions - completed 8/21. Educate patient on  s/s of infection and when to call physician - completed 8/21 Educate on importance of taking antibiotic as prescribed and completing full prescribed course of antibiotic treatment.  Reports attending follow-up appointment with hand specialist on Monday, 8/28 and  states hand is healing well.  Denies pain.  Reports limited range of motion with one of his fingers and specialist will make referral for outpatient PT.   Has a scheduled follow-up appointment with infectious disease specialist on 03/22/3033.   Discuss with patient no primary care physician on record. And assist in finding PCP and scheduling visit.    Patient Goals/Self-Care Activities: Patient will complete dressing changes as prescribed.and verbally teach back.   Patient will notify providers of complications associated with s/s of worsening infection. Patient will take full course of antibiotic as prescribed by provider.  Reports he continues to take antibiotics as prescribed and is following up with infectious disease specialist on 03/23/2023.  Primary Care Provider identified and visit            Plan: Telephone follow up appointment with care management team member scheduled ION:[id number], September 4th at 10 am.  Patient to reach out with any questions or concerns.   Agnes Probert J. Cristela Felt, RN, BSN, MSN Care Management Coordinator/[place] Phone Number:  [phone]

## 2023-03-18 LAB — ACID FAST SMEAR (AFB, MYCOBACTERIA): Acid Fast Smear: NEGATIVE

## 2023-03-23 ENCOUNTER — Inpatient Hospital Stay: Payer: Medicaid Other | Admitting: Internal Medicine

## 2023-03-23 NOTE — Progress Notes (Deleted)
Patient Active Problem List   Diagnosis Date Noted   Infection of right hand 03/07/2023   Asthma, chronic 03/07/2023    Patient's Medications  New Prescriptions   No medications on file  Previous Medications   ACETAMINOPHEN (TYLENOL) 500 MG TABLET    Take 1,000 mg by mouth every 6 (six) hours as needed for mild pain.   ALBUTEROL (PROVENTIL HFA;VENTOLIN HFA) 108 (90 BASE) MCG/ACT INHALER    Inhale 2 puffs into the lungs every 6 (six) hours as needed. For shortness of breath   AMOXICILLIN-CLAVULANATE (AUGMENTIN) 875-125 MG TABLET    Take 1 tablet by mouth 2 (two) times daily.  Modified Medications   No medications on file  Discontinued Medications   No medications on file    Subjective: 32 year old male presents for hospital follow-up right hand osteomyelitis/abscess status post I&D with OR cultures growing group B strep.  Patient had punched a tree 3 weeks ago and incurred right hand infection.  Stat MRI with Ortho showed large joint decision consider septic arthritis, metacarpal heads consider osteomyelitis.  He underwent I&D, bone softening and purulence and currently 8/18 with cultures growing group B strep.  History of vancomycin and pip-tazo inpatient and discharged on Augmentin x 6 weeks EOT 9/28    Review of Systems: Review of Systems  All other systems reviewed and are negative.   Past Medical History:  Diagnosis Date   Asthma    Bronchitis     Social History   Tobacco Use   Smoking status: Every Day    Current packs/day: 1.00    Types: Cigarettes  Substance Use Topics   Alcohol use: No   Drug use: Yes    Types: Marijuana    Comment: last joint 1 week ago    Family History  Family history unknown: Yes    Allergies  Allergen Reactions   Coconut (Cocos Nucifera) Anaphylaxis    Mouth swelling    Health Maintenance  Topic Date Due   Hepatitis C Screening  Never done   DTaP/Tdap/Td (1 - Tdap) Never done   INFLUENZA VACCINE  Never done    COVID-19 Vaccine (1 - 2023-24 season) Never done   HIV Screening  Completed   HPV VACCINES  Aged Out    Objective:  There were no vitals filed for this visit. There is no height or weight on file to calculate BMI.  Physical Exam Constitutional:      General: He is not in acute distress.    Appearance: He is normal weight. He is not toxic-appearing.  HENT:     Head: Normocephalic and atraumatic.     Right Ear: External ear normal.     Left Ear: External ear normal.     Nose: No congestion or rhinorrhea.     Mouth/Throat:     Mouth: Mucous membranes are moist.     Pharynx: Oropharynx is clear.  Eyes:     Extraocular Movements: Extraocular movements intact.     Conjunctiva/sclera: Conjunctivae normal.     Pupils: Pupils are equal, round, and reactive to light.  Cardiovascular:     Rate and Rhythm: Normal rate and regular rhythm.     Heart sounds: No murmur heard.    No friction rub. No gallop.  Pulmonary:     Effort: Pulmonary effort is normal.     Breath sounds: Normal breath sounds.  Abdominal:     General: Abdomen is flat. Bowel sounds are  normal.     Palpations: Abdomen is soft.  Musculoskeletal:        General: No swelling. Normal range of motion.     Cervical back: Normal range of motion and neck supple.  Skin:    General: Skin is warm and dry.  Neurological:     General: No focal deficit present.     Mental Status: He is oriented to person, place, and time.  Psychiatric:        Mood and Affect: Mood normal.     Lab Results Lab Results  Component Value Date   WBC 9.7 03/06/2023   HGB 13.7 03/06/2023   HCT 40.5 03/06/2023   MCV 94.0 03/06/2023   PLT 311 03/06/2023    Lab Results  Component Value Date   CREATININE 1.20 03/09/2023   BUN 13 03/09/2023   NA 139 03/09/2023   K 3.8 03/09/2023   CL 103 03/09/2023   CO2 25 03/09/2023    Lab Results  Component Value Date   ALT 24 01/15/2014   AST 32 01/15/2014   ALKPHOS 53 01/15/2014   BILITOT 0.5  01/15/2014    No results found for: "CHOL", "HDL", "LDLCALC", "LDLDIRECT", "TRIG", "CHOLHDL" No results found for: "LABRPR", "RPRTITER" No results found for: "HIV1RNAQUANT", "HIV1RNAVL", "CD4TABS"   Problem List Items Addressed This Visit   None  Assessment/Plan #Right hand osteomyelitis/abscess status post I&D  -labs today   Danelle Earthly, MD [facility] 03/23/2023, 12:23 PM

## 2023-03-24 ENCOUNTER — Other Ambulatory Visit: Payer: Medicaid Other

## 2023-03-24 ENCOUNTER — Telehealth: Payer: Self-pay

## 2023-03-24 NOTE — Patient Instructions (Signed)
Visit Information  Thank you for taking time to visit with me today. Please don't hesitate to contact me if I can be of assistance to you before our next scheduled telephone appointment.  Our next appointment is by telephone on 04/02/23 at 2 pm  Following is a copy of your care plan:   Goals Addressed             This Visit's Progress    Transition of Care       Current Barriers:  Knowledge Deficits related to plan of care for management of right hand infection.    RNCM Clinical Goal(s):  Patient will verbalize understanding of plan for management of right hand infection  as evidenced by verbalization of understanding of wound care management  through collaboration with RN Care manager, provider, and care team.   Patient will attend scheduled appointments with specialist.    Interventions: Evaluation of current treatment plan related to  self management and patient's adherence to plan as established by specialists. - complete 8/21 Educate patient on wound care instructions - completed 8/21. Educate patient on  s/s of infection and when to call physician - completed 8/21 Educate on importance of taking antibiotic as prescribed and completing full prescribed course of antibiotic treatment.  Reports attending follow-up appointment with hand specialist on Monday, 8/28 and states hand is healing well.  Denies pain.  Reports limited range of motion with one of his fingers and specialist will make referral for outpatient PT.   Has a scheduled follow-up appointment with infectious disease specialist on 03/22/3033.  9/4 status update: missed appointment, has contact information and patient will call to reschedule.   Discuss with patient no primary care physician on record. And assist in finding PCP and scheduling visit.    Patient Goals/Self-Care Activities: Patient will complete dressing changes as prescribed.and verbally teach back.   Patient will notify providers of complications associated  with s/s of worsening infection. Patient will take full course of antibiotic as prescribed by provider.  Reports he continues to take antibiotics as prescribed and is following up with infectious disease specialist on 03/23/2023 (missed). Identify Primary Care Provider  and initial visit will be scheduled.  Please contact Infectious Disease office to reschedule appointment.            Patient verbalizes understanding of instructions and care plan provided today and agrees to view in MyChart. Active MyChart status and patient understanding of how to access instructions and care plan via MyChart confirmed with patient.     Telephone follow up appointment with care management team member scheduled for: Follow up with provider re: Pain with Orthopedic Surgeon.  Call ID office to reschedule appointment.    Please call the care guide team at [phone] if you need to cancel or reschedule your appointment.   Please call the [place] National Suicide Prevention Lifeline: 250-293-5033 or TTY: 715-540-8043 TTY 734-250-7262) to talk to a trained counselor if you are experiencing a Mental Health or Behavioral Health Crisis or need someone to talk to.  Paul Ochoa J. Cristela Felt, RN, BSN, MSN Care Management Coordinator/[place] Phone Number:  [phone]

## 2023-03-24 NOTE — Patient Outreach (Signed)
  Care Management  Transitions of Care Program Transitions of Care Post-discharge week 3   03/24/2023 Name: Paul Ochoa MRN: [id number] DOB: [birth year]  Subjective: Paul Ochoa is a 32 y.o. year old male who is a primary care patient of Pcp, No. The Care Management team Engaged with patient Engaged with patient by telephone to assess and address transitions of care needs.   Consent to Services:  Patient was given information about care management services, agreed to services, and gave verbal consent to participate.   Assessment:           SDOH Interventions    Flowsheet Row Telephone from 03/24/2023 in [place] POPULATION HEALTH DEPARTMENT Telephone from 03/17/2023 in [place] POPULATION HEALTH DEPARTMENT  SDOH Interventions    Food Insecurity Interventions Intervention Not Indicated --  Housing Interventions -- Patient Declined  Utilities Interventions -- Patient Declined        Goals Addressed             This Visit's Progress    Transition of Care       Current Barriers:  Knowledge Deficits related to plan of care for management of right hand infection.    RNCM Clinical Goal(s):  Patient will verbalize understanding of plan for management of right hand infection  as evidenced by verbalization of understanding of wound care management  through collaboration with RN Care manager, provider, and care team.   Patient will attend scheduled appointments with specialist.    Interventions: Evaluation of current treatment plan related to  self management and patient's adherence to plan as established by specialists. - complete 8/21 Educate patient on wound care instructions - completed 8/21. Educate patient on  s/s of infection and when to call physician - completed 8/21 Educate on importance of taking antibiotic as prescribed and completing full prescribed course of antibiotic treatment.  Reports attending follow-up appointment with hand specialist on Monday,  8/28 and states hand is healing well.  Denies pain.  Reports limited range of motion with one of his fingers and specialist will make referral for outpatient PT.   Has a scheduled follow-up appointment with infectious disease specialist on 03/22/3033.  9/4 status update: missed appointment, has contact information and patient will call to reschedule.   Discuss with patient no primary care physician on record. And assist in finding PCP and scheduling visit.    Patient Goals/Self-Care Activities: Patient will complete dressing changes as prescribed.and verbally teach back.   Patient will notify providers of complications associated with s/s of worsening infection. Patient will take full course of antibiotic as prescribed by provider.  Reports he continues to take antibiotics as prescribed and is following up with infectious disease specialist on 03/23/2023 (missed). Identify Primary Care Provider  and initial visit will be scheduled.  Please contact Infectious Disease office to reschedule appointment.            Plan: Telephone follow up appointment with care management team member scheduled for: Friday, 04/02/23.   Julio Zappia J. Cristela Felt, RN, BSN, MSN Care Management Coordinator/[place] Phone Number:  [phone]

## 2023-03-31 ENCOUNTER — Other Ambulatory Visit: Payer: Medicaid Other

## 2023-04-05 LAB — FUNGUS CULTURE WITH STAIN

## 2023-04-05 LAB — FUNGAL ORGANISM REFLEX

## 2023-04-05 LAB — FUNGUS CULTURE RESULT

## 2023-04-06 ENCOUNTER — Other Ambulatory Visit: Payer: Medicaid Other

## 2023-04-06 ENCOUNTER — Telehealth: Payer: Self-pay

## 2023-04-06 NOTE — Patient Instructions (Signed)
Visit Information  Thank you for taking time to visit with me today. Today was our final Transition of Care telephone visit.   It sounds like your right hand is healing well and you are following up with all your specialists. Now, it's important to try to find a Primary Care Physician for preventative care! The MD provider Line is [phone].   Please don't hesitate to contact me if I can be of assistance to you before our next scheduled telephone appointment.  Warm regards,  Elnita Maxwell  Following is a copy of your care plan:   Goals Addressed             This Visit's Progress    COMPLETED: Transition of Care       Current Barriers:  Knowledge Deficits related to plan of care for management of right hand infection.    RNCM Clinical Goal(s):  Patient will verbalize understanding of plan for management of right hand infection  as evidenced by verbalization of understanding of wound care management  through collaboration with RN Care manager, provider, and care team.   Patient will attend scheduled appointments with specialist.    Interventions: Evaluation of current treatment plan related to  self management and patient's adherence to plan as established by specialists. - complete 8/21, revisited and completed 9/17 Educate patient on wound care instructions - completed 8/21. Educate patient on  s/s of infection and when to call physician - completed 8/21, 9/17. Educate on importance of taking antibiotic as prescribed and completing full prescribed course of antibiotic treatment. Reviewed 9/17, patient to continue Augmentin as ordered until 04/18/23 or otherwise directed by Infectious Disease Specialist Reports attending follow-up appointment with Orthopedic MD Dr. Frazier Butt regarding his R hand wound infection/osteomyelitis/osteoarthritis on 9/10 and states hand is healing well.  Denies pain.  Reports limited range of motion with one of his fingers and Orthopedic Specialist Dr. Frazier Butt  made referral for outpatient PT.   He missed his scheduled follow-up appointment with Infectious Disease specialist on 03/22/3033, per patient, he has called to reschedule.   Discussed with patient regarding having no primary care physician on record. Patient was receptive to receiving the MD Provider Line Referral # [phone] and committed to engaging a PCP.  Patient Goals/Self-Care Activities: Patient will complete dressing changes as prescribed.and verbally teach back.   Patient will notify providers of complications associated with s/s of worsening infection. Patient will take full course of antibiotic as prescribed by provider.  Reports he continues to take antibiotics as prescribed and is following up with Infectious Disease specialist (had to reschedule appt) and continues to see Orthopedic specialist as scheduled. Patient committed to choosing a Primary Care Provider  and initial visit will be scheduled.             Patient verbalizes understanding of instructions and care plan provided today and agrees to view in MyChart. Active MyChart status and patient understanding of how to access instructions and care plan via MyChart confirmed with patient.     The patient has been provided with contact information for the care management team and has been advised to call with any health related questions or concerns.   Please call the care guide team at [phone] if you need to cancel or reschedule your appointment.   Please call 1-800-273-TALK (toll free, 24 hour hotline) if you are experiencing a Mental Health or Behavioral Health Crisis or need someone to talk to.  Alyse Low, RN, BA, CHPN,  CRRN [facility] Population Health Care Management Coordinator, Transition of Care Ph [phone]

## 2023-04-06 NOTE — Patient Outreach (Signed)
Care Management  Transitions of Care Program Managed Medicaid Transitions of Care week 4   04/06/2023 Name: CLEMENTS OHAYON MRN: [id number] DOB: Jul 15, 1991  Subjective: Paul Ochoa is a 32 y.o. year old male who is a primary care patient of Pcp, No. The Care Management team Engaged with patient Engaged with patient by telephone to assess and address transitions of care needs.   Consent to Services:  Patient was given information about Managed Medicaid Care Management services, agreed to services, and gave verbal consent to participate.   Assessment:           SDOH Interventions    Flowsheet Row Telephone from 04/06/2023 in [place] POPULATION HEALTH DEPARTMENT Telephone from 03/24/2023 in [place] POPULATION HEALTH DEPARTMENT Telephone from 03/17/2023 in  POPULATION HEALTH DEPARTMENT  SDOH Interventions     Food Insecurity Interventions Intervention Not Indicated Intervention Not Indicated --  Housing Interventions -- -- Patient Declined  Utilities Interventions -- -- Patient Declined  Health Literacy Interventions Intervention Not Indicated -- --        Goals Addressed             This Visit's Progress    COMPLETED: Transition of Care       Current Barriers:  Knowledge Deficits related to plan of care for management of right hand infection.    RNCM Clinical Goal(s):  Patient will verbalize understanding of plan for management of right hand infection  as evidenced by verbalization of understanding of wound care management  through collaboration with RN Care manager, provider, and care team.   Patient will attend scheduled appointments with specialist.    Interventions: Evaluation of current treatment plan related to  self management and patient's adherence to plan as established by specialists. - complete 8/21, revisited and completed 9/17 Educate patient on wound care instructions - completed 8/21. Educate patient on  s/s of infection and when to  call physician - completed 8/21, 9/17. Educate on importance of taking antibiotic as prescribed and completing full prescribed course of antibiotic treatment. Reviewed 9/17, patient to continue Augmentin as ordered until 04/18/23 or otherwise directed by Infectious Disease Specialist Reports attending follow-up appointment with Orthopedic MD Dr. Frazier Butt regarding his R hand wound infection/osteomyelitis/osteoarthritis on 9/10 and states hand is healing well.  Denies pain.  Reports limited range of motion with one of his fingers and Orthopedic Specialist Dr. Frazier Butt made referral for outpatient PT.   He missed his scheduled follow-up appointment with Infectious Disease specialist on 03/22/3033, per patient, he has called to reschedule.   Discussed with patient regarding having no primary care physician on record. Patient was receptive to receiving the MD Provider Line Referral # [id number] and committed to engaging a PCP.  Patient Goals/Self-Care Activities: Patient will complete dressing changes as prescribed.and verbally teach back.   Patient will notify providers of complications associated with s/s of worsening infection. Patient will take full course of antibiotic as prescribed by provider.  Reports he continues to take antibiotics as prescribed and is following up with Infectious Disease specialist (had to reschedule appt) and continues to see Orthopedic specialist as scheduled. Patient committed to choosing a Primary Care Provider  and initial visit will be scheduled.             Plan: This is the final Transition of Care program visit with RNCM-TOC nurse.The patient has been provided with contact information for the care management team and has been advised to call with any health  related questions or concerns.  The patient will call MD Provider Line [phone] * as advised to engage a primary Care provider and schedule an initial visit .   Alyse Low, RN, BA, [facility], CRRN [facility]  [facility] Coordinator, Transition of Care Ph # [phone]

## 2023-04-15 LAB — FUNGUS CULTURE WITH STAIN

## 2023-04-15 LAB — FUNGUS CULTURE RESULT

## 2023-04-15 LAB — FUNGAL ORGANISM REFLEX

## 2023-04-19 LAB — ACID FAST CULTURE WITH REFLEXED SENSITIVITIES (MYCOBACTERIA): Acid Fast Culture: NEGATIVE

## 2023-04-30 LAB — ACID FAST CULTURE WITH REFLEXED SENSITIVITIES (MYCOBACTERIA): Acid Fast Culture: NEGATIVE

## 2023-05-08 ENCOUNTER — Telehealth: Payer: Medicaid Other | Admitting: Family Medicine

## 2023-05-08 DIAGNOSIS — A64 Unspecified sexually transmitted disease: Secondary | ICD-10-CM | POA: Diagnosis not present

## 2023-05-08 NOTE — Patient Instructions (Signed)
Paul Ochoa, thank you for joining [name], PA-C for today's virtual visit.  While this provider is not your primary care provider (PCP), if your PCP is located in our provider database this encounter information will be shared with them immediately following your visit.   A [facility] MyChart account gives you access to today's visit and all your visits, tests, and labs performed at [facility] " click here if you don't have a [facility] MyChart account or go to [email or web address]  Consent: (Patient) Paul Ochoa provided verbal consent for this virtual visit at the beginning of the encounter.  Current Medications:  Current Outpatient Medications:    acetaminophen (TYLENOL) 500 MG tablet, Take 1,000 mg by mouth every 6 (six) hours as needed for mild pain. (Patient not taking: Reported on 04/06/2023), Disp: , Rfl:    albuterol (PROVENTIL HFA;VENTOLIN HFA) 108 (90 BASE) MCG/ACT inhaler, Inhale 2 puffs into the lungs every 6 (six) hours as needed. For shortness of breath (Patient not taking: Reported on 04/06/2023), Disp: , Rfl:  No current facility-administered medications for this visit.  Facility-Administered Medications Ordered in Other Visits:    chlorhexidine (HIBICLENS) 4 % liquid 4 application, 60 mL, Topical, Once, Betha Loa, MD   Medications ordered in this encounter:  No orders of the defined types were placed in this encounter.    *If you need refills on other medications prior to your next appointment, please contact your pharmacy*  Follow-Up: Call back or seek an in-person evaluation if the symptoms worsen or if the condition fails to improve as anticipated.  [facility] [phone]  Other Instructions Preventing Sexually Transmitted Infections, Adult Sexually transmitted infections (STIs) are spread from person to person (are contagious). They are spread, or transmitted, during sex. The sex may be vaginal, anal, or  oral. STIs can be passed during sexual contact with skin, genitals, mouth, or rectum. They may spread through body fluids, such as saliva, semen, blood, vaginal mucus, and urine. STIs are very common. They can happen in people of all ages. Some common STIs are: Herpes. Hepatitis B. Chlamydia. Gonorrhea. Syphilis. Trichomoniasis. Human papillomavirus (HPV). Human immunodeficiency virus (HIV). This can cause acquired immunodeficiency syndrome (AIDS). How can STIs affect me? You may not have symptoms with an STI. Even if you do not have symptoms, you can still spread the infection to others. You also still need treatment. STIs can be treated. Some STIs can be cured. Other STIs cannot be cured and will affect you for the rest of your life. Certain STIs may: Require you to take medicine for the rest of your life. Affect your ability to have children. Increase your risk for getting other STIs. Increase your risk of getting certain conditions. These may include: Cervical cancer. Pelvic inflammatory disease (PID). Organ damage or damage to other parts of your body. This can happen if the infection spreads. Cause problems during pregnancy. STIs may be spread to the baby during pregnancy or birth. Females tend to have more severe problems from STIs than males. What can increase my risk? You may be more at risk for an STI if: You do not use protection during sex. You have more than one sex partner. You have a sex partner who has other sex partners. You have sex with a person who has an STI. You have an STI, or you have had an STI before. You inject drugs or have a sex partner who injects drugs. What actions can I take to  prevent STIs? The only way to fully prevent STIs is not to have sex of any kind. This is called practicing abstinence. If you are sexually active, you can protect yourself and others by taking these actions to lower your risk of getting an STI: Lifestyle Have only one sex  partner or limit the number of sex partners you have. Avoid having sex after you have alcohol or drugs. Alcohol and drugs can affect your ability to make good choices. This can lead to risky sexual behaviors. Go to prevention counseling. This can teach you how to avoid getting an STI. Barrier protection  Use methods to stop body fluids from being exchanged between partners during sex (barrier protection). These methods can be used during oral, vaginal, or anal sex. They include: External condom, for males. Internal condom, for females. Dental dam. Use a new barrier method for every sex act from start to finish. Know that a barrier method may not protect you from all STIs. Some STIs, such as herpes, are spread through skin-to-skin contact. Avoid all sexual contact if you or a partner has herpes and there is an active flare with open sores. Birth control pills, injections, implants, and intrauterine devices (IUDs) do not protect against STIs. To prevent both STIs and pregnancy, always use a condom with a second form of birth control. General information Ask your health care provider about taking pre-exposure prophylaxis (PrEP) to prevent HIV. Stay up to date on your vaccines. Some vaccines can lower your risk of getting certain STIs. These include: Hepatitis B vaccine. HPV vaccine. This is recommended for people up to age 35. Get tested for STIs. Have your partners get tested, too. If you test positive for an STI, follow recommendations from your health care provider about treatment. Make sure your sex partners are tested and treated as well. Where to find more information Learn more about STIs from: Centers for Disease Control and Prevention (CDC): More information about certain STIs: TonerPromos.no Places to get sexual health counseling and treatment for free or at a low cost: gettested.TonerPromos.no U.S. Department of Health and Human Services [facility]): TravelLesson.ca This information is not intended to  replace advice given to you by your health care provider. Make sure you discuss any questions you have with your health care provider. Document Revised: 07/16/2022 Document Reviewed: 12/19/2021 Elsevier Patient Education  2024 Elsevier Inc.    If you have been instructed to have an in-person evaluation today at a local Urgent Care facility, please use the link below. It will take you to a list of all of our available [facility], including address, phone number and hours of operation. Please do not delay care.  [facility]  If you or a family member do not have a primary care provider, use the link below to schedule a visit and establish care. When you choose a [facility] primary care physician or advanced practice provider, you gain a long-term partner in health. Find a Primary Care Provider  Learn more about [facility]'s in-office and virtual care options: [facility] - Get Care Now

## 2023-05-08 NOTE — Progress Notes (Signed)
Virtual Visit Consent   Paul Ochoa, you are scheduled for a virtual visit with a [facility] provider today. Just as with appointments in the office, your consent must be obtained to participate. Your consent will be active for this visit and any virtual visit you may have with one of our providers in the next 365 days. If you have a MyChart account, a copy of this consent can be sent to you electronically.  As this is a virtual visit, video technology does not allow for your provider to perform a traditional examination. This may limit your provider's ability to fully assess your condition. If your provider identifies any concerns that need to be evaluated in person or the need to arrange testing (such as labs, EKG, etc.), we will make arrangements to do so. Although advances in technology are sophisticated, we cannot ensure that it will always work on either your end or our end. If the connection with a video visit is poor, the visit may have to be switched to a telephone visit. With either a video or telephone visit, we are not always able to ensure that we have a secure connection.  By engaging in this virtual visit, you consent to the provision of healthcare and authorize for your insurance to be billed (if applicable) for the services provided during this visit. Depending on your insurance coverage, you may receive a charge related to this service.  I need to obtain your verbal consent now. Are you willing to proceed with your visit today? [name] has provided verbal consent on 05/08/2023 for a virtual visit (video or telephone). Paul Ochoa, [place]  Date: 05/08/2023 9:18 AM  Virtual Visit via Video Note   I, Paul Ochoa, connected with  RODRICO ASPLUND  ([id number], 04/08/1991) on 05/08/23 at  9:15 AM EDT by a video-enabled telemedicine application and verified that I am speaking with the correct person using two identifiers.  Location: Patient: Virtual Visit Location  Patient: Home Provider: Virtual Visit Location Provider: Home Office   I discussed the limitations of evaluation and management by telemedicine and the availability of in person appointments. The patient expressed understanding and agreed to proceed.    History of Present Illness: Paul Ochoa is a 32 y.o. who identifies as a male who was assigned male at birth, and is being seen today for c/o states his spouse was tested a couple days ago that she has trichomoniasis. Pt states he has no symptoms.   HPI: HPI  Problems:  Patient Active Problem List   Diagnosis Date Noted   Infection of right hand 03/07/2023   Asthma, chronic 03/07/2023    Allergies:  Allergies  Allergen Reactions   Coconut (Cocos Nucifera) Anaphylaxis    Mouth swelling   Medications:  Current Outpatient Medications:    acetaminophen (TYLENOL) 500 MG tablet, Take 1,000 mg by mouth every 6 (six) hours as needed for mild pain. (Patient not taking: Reported on 04/06/2023), Disp: , Rfl:    albuterol (PROVENTIL HFA;VENTOLIN HFA) 108 (90 BASE) MCG/ACT inhaler, Inhale 2 puffs into the lungs every 6 (six) hours as needed. For shortness of breath (Patient not taking: Reported on 04/06/2023), Disp: , Rfl:  No current facility-administered medications for this visit.  Facility-Administered Medications Ordered in Other Visits:    chlorhexidine (HIBICLENS) 4 % liquid 4 application, 60 mL, Topical, Once, Betha Loa, MD  Observations/Objective: Patient is well-developed, well-nourished in no acute distress.  Resting comfortably at home.  Head is normocephalic,  atraumatic.  No labored breathing.  Speech is clear and coherent with logical content.  Patient is alert and oriented at baseline.    Assessment and Plan: 1. Sexually transmitted disease  -Advised Pt to follow up at an urgent care or PCP to be tested and treated for infection  -Pt verbalized understanding.   Follow Up Instructions: I discussed the assessment  and treatment plan with the patient. The patient was provided an opportunity to ask questions and all were answered. The patient agreed with the plan and demonstrated an understanding of the instructions.  A copy of instructions were sent to the patient via MyChart unless otherwise noted below.     The patient was advised to call back or seek an in-person evaluation if the symptoms worsen or if the condition fails to improve as anticipated.    Paul Pandy, PA-C

## 2023-05-09 DIAGNOSIS — Z202 Contact with and (suspected) exposure to infections with a predominantly sexual mode of transmission: Secondary | ICD-10-CM | POA: Diagnosis not present

## 2023-05-09 DIAGNOSIS — Z7251 High risk heterosexual behavior: Secondary | ICD-10-CM | POA: Diagnosis not present

## 2023-05-11 DIAGNOSIS — M86141 Other acute osteomyelitis, right hand: Secondary | ICD-10-CM | POA: Diagnosis not present

## 2023-05-31 ENCOUNTER — Other Ambulatory Visit (HOSPITAL_COMMUNITY): Payer: Medicaid Other | Attending: Oncology

## 2023-06-21 ENCOUNTER — Encounter (HOSPITAL_COMMUNITY): Payer: Self-pay

## 2023-06-21 ENCOUNTER — Other Ambulatory Visit: Payer: Self-pay

## 2023-06-21 ENCOUNTER — Emergency Department (HOSPITAL_COMMUNITY)
Admission: EM | Admit: 2023-06-21 | Discharge: 2023-06-21 | Disposition: A | Discharge status: Home / Self Care | Payer: Medicaid Other | Attending: Emergency Medicine | Admitting: Emergency Medicine

## 2023-06-21 DIAGNOSIS — R197 Diarrhea, unspecified: Secondary | ICD-10-CM | POA: Diagnosis not present

## 2023-06-21 DIAGNOSIS — Z79899 Other long term (current) drug therapy: Secondary | ICD-10-CM | POA: Insufficient documentation

## 2023-06-21 DIAGNOSIS — R1032 Left lower quadrant pain: Secondary | ICD-10-CM | POA: Insufficient documentation

## 2023-06-21 DIAGNOSIS — D72829 Elevated white blood cell count, unspecified: Secondary | ICD-10-CM | POA: Insufficient documentation

## 2023-06-21 DIAGNOSIS — R1110 Vomiting, unspecified: Secondary | ICD-10-CM | POA: Diagnosis not present

## 2023-06-21 DIAGNOSIS — R1084 Generalized abdominal pain: Secondary | ICD-10-CM | POA: Diagnosis not present

## 2023-06-21 DIAGNOSIS — R1031 Right lower quadrant pain: Secondary | ICD-10-CM | POA: Insufficient documentation

## 2023-06-21 DIAGNOSIS — R109 Unspecified abdominal pain: Secondary | ICD-10-CM

## 2023-06-21 LAB — COMPREHENSIVE METABOLIC PANEL
ALT: 23 U/L (ref 0–44)
AST: 24 U/L (ref 15–41)
Albumin: 4.5 g/dL (ref 3.5–5.0)
Alkaline Phosphatase: 46 U/L (ref 38–126)
Anion gap: 10 (ref 5–15)
BUN: 7 mg/dL (ref 6–20)
CO2: 25 mmol/L (ref 22–32)
Calcium: 9.1 mg/dL (ref 8.9–10.3)
Chloride: 103 mmol/L (ref 98–111)
Creatinine, Ser: 1.01 mg/dL (ref 0.61–1.24)
GFR, Estimated: >60 mL/min (ref >60)
Glucose, Bld: 100 mg/dL — ABNORMAL HIGH (ref 70–99)
Potassium: 3.5 mmol/L (ref 3.5–5.1)
Sodium: 138 mmol/L (ref 135–145)
Total Bilirubin: 0.6 mg/dL (ref <1.2)
Total Protein: 7.4 g/dL (ref 6.5–8.1)

## 2023-06-21 LAB — CBC
HCT: 44.5 % (ref 39.0–52.0)
Hemoglobin: 15.5 g/dL (ref 13.0–17.0)
MCH: 32.5 pg (ref 26.0–34.0)
MCHC: 34.8 g/dL (ref 30.0–36.0)
MCV: 93.3 fL (ref 80.0–100.0)
Platelets: 234 10*3/uL (ref 150–400)
RBC: 4.77 MIL/uL (ref 4.22–5.81)
RDW: 13.2 % (ref 11.5–15.5)
WBC: 15.4 10*3/uL — ABNORMAL HIGH (ref 4.0–10.5)
nRBC: 0 % (ref 0.0–0.2)

## 2023-06-21 LAB — URINALYSIS, ROUTINE W REFLEX MICROSCOPIC
Bacteria, UA: NONE SEEN
Bilirubin Urine: NEGATIVE
Glucose, UA: NEGATIVE mg/dL
Hgb urine dipstick: NEGATIVE
Ketones, ur: NEGATIVE mg/dL
Leukocytes,Ua: NEGATIVE
Nitrite: NEGATIVE
Protein, ur: NEGATIVE mg/dL
Specific Gravity, Urine: 1.003 — ABNORMAL LOW (ref 1.005–1.030)
pH: 7 (ref 5.0–8.0)

## 2023-06-21 LAB — TYPE AND SCREEN
ABO/RH(D): B POS
Antibody Screen: NEGATIVE

## 2023-06-21 LAB — LIPASE, BLOOD: Lipase: 40 U/L (ref 11–51)

## 2023-06-21 MED ORDER — ONDANSETRON 8 MG PO TBDP
8.0000 mg | ORAL_TABLET | Freq: Three times a day (TID) | ORAL | 0 refills | Status: AC | PRN
Start: 2023-06-21 — End: ?

## 2023-06-21 MED ORDER — SODIUM CHLORIDE 0.9 % IV BOLUS
1000.0000 mL | Freq: Once | INTRAVENOUS | Status: AC
  Administered 2023-06-21: 1000 mL via INTRAVENOUS

## 2023-06-21 MED ORDER — DICYCLOMINE HCL 20 MG PO TABS: 20.0000 mg | ORAL_TABLET | Freq: Two times a day (BID) | ORAL | 0 refills | Status: AC

## 2023-06-21 MED ORDER — ONDANSETRON HCL 4 MG/2ML IJ SOLN
4.0000 mg | Freq: Once | INTRAMUSCULAR | Status: AC
  Administered 2023-06-21: 4 mg via INTRAVENOUS
  Filled 2023-06-21: qty 2

## 2023-06-21 NOTE — ED Provider Notes (Signed)
[facility] EMERGENCY DEPARTMENT AT [facility] Provider Note   CSN: [id number] Arrival date & time: 06/21/23  2130     History  Chief Complaint  Patient presents with   Abdominal Pain   Melena         Paul Ochoa is a 32 y.o. male.   Abdominal Pain    Patient presented to the ED for evaluation of abdominal pain that started about 24 hours ago.  Patient states he is having severe cramping pain mostly in his lower abdomen.  He has had multiple episodes of vomiting and diarrhea.  Patient states he has also noticed some blood in his stool.  No fevers.  No prior history of similar symptoms.  Home Medications Prior to Admission medications   Medication Sig Start Date End Date Taking? Authorizing Provider  dicyclomine (BENTYL) 20 MG tablet Take 1 tablet (20 mg total) by mouth 2 (two) times daily. 06/21/23  Yes Linwood Dibbles, MD  ondansetron (ZOFRAN-ODT) 8 MG disintegrating tablet Take 1 tablet (8 mg total) by mouth every 8 (eight) hours as needed for nausea or vomiting. 06/21/23  Yes Linwood Dibbles, MD  acetaminophen (TYLENOL) 500 MG tablet Take 1,000 mg by mouth every 6 (six) hours as needed for mild pain. Patient not taking: Reported on 04/06/2023    [provider]  albuterol (PROVENTIL HFA;VENTOLIN HFA) 108 (90 BASE) MCG/ACT inhaler Inhale 2 puffs into the lungs every 6 (six) hours as needed. For shortness of breath Patient not taking: Reported on 04/06/2023    [provider]      Allergies    Coconut (cocos nucifera)    Review of Systems   Review of Systems  Gastrointestinal:  Positive for abdominal pain.    Physical Exam Updated Vital Signs BP 127/79   Pulse 64   Temp 98.2 F (36.8 C) (Oral)   Resp 16   Ht 1.702 m (5\' 7" )   Wt 79.4 kg   SpO2 100%   BMI 27.41 kg/m  Physical Exam Vitals and nursing note reviewed.  Constitutional:      General: He is not in acute distress.    Appearance: He is well-developed.  HENT:     Head:  Normocephalic and atraumatic.     Right Ear: External ear normal.     Left Ear: External ear normal.  Eyes:     General: No scleral icterus.       Right eye: No discharge.        Left eye: No discharge.     Conjunctiva/sclera: Conjunctivae normal.  Neck:     Trachea: No tracheal deviation.  Cardiovascular:     Rate and Rhythm: Normal rate and regular rhythm.  Pulmonary:     Effort: Pulmonary effort is normal. No respiratory distress.     Breath sounds: Normal breath sounds. No stridor. No wheezing or rales.  Abdominal:     General: Bowel sounds are normal. There is no distension.     Palpations: Abdomen is soft.     Tenderness: There is generalized abdominal tenderness and tenderness in the right lower quadrant and left lower quadrant. There is no guarding or rebound.  Genitourinary:    Comments: Rectal exam refused by patient Musculoskeletal:        General: No tenderness or deformity.     Cervical back: Neck supple.  Skin:    General: Skin is warm and dry.     Findings: No rash.  Neurological:  General: No focal deficit present.     Mental Status: He is alert.     Cranial Nerves: No cranial nerve deficit, dysarthria or facial asymmetry.     Sensory: No sensory deficit.     Motor: No abnormal muscle tone or seizure activity.     Coordination: Coordination normal.  Psychiatric:        Mood and Affect: Mood normal.     ED Results / Procedures / Treatments   Labs (all labs ordered are listed, but only abnormal results are displayed) Labs Reviewed  COMPREHENSIVE METABOLIC PANEL - Abnormal; Notable for the following components:      Result Value   Glucose, Bld 100 (*)    All other components within normal limits  CBC - Abnormal; Notable for the following components:   WBC 15.4 (*)    All other components within normal limits  URINALYSIS, ROUTINE W REFLEX MICROSCOPIC - Abnormal; Notable for the following components:   Color, Urine COLORLESS (*)    Specific Gravity,  Urine 1.003 (*)    All other components within normal limits  LIPASE, BLOOD  TYPE AND SCREEN    EKG None  Radiology No results found.  Procedures Procedures    Medications Ordered in ED Medications  sodium chloride 0.9 % bolus 1,000 mL (0 mLs Intravenous Stopped 06/21/23 0930)  ondansetron (ZOFRAN) injection 4 mg (4 mg Intravenous Given 06/21/23 0819)    ED Course/ Medical Decision Making/ A&P Clinical Course as of 06/21/23 1043  Mon Jun 21, 2023  1029 White blood cell count evaded at 15.  Urinalysis negative. [JK]  1029   Metabolic panel normal [JK]  1042 Patient states he is feeling better.  Abdomen is nontender [JK]    Clinical Course User Index [JK] Linwood Dibbles, MD                                 Medical Decision Making Differential diagnosis includes but not limited to gastroenteritis food poisoning appendicitis colitis  Problems Addressed: Abdominal pain, unspecified abdominal location: acute illness or injury that poses a threat to life or bodily functions  Amount and/or Complexity of Data Reviewed Labs: ordered. Decision-making details documented in ED Course.  Risk Prescription drug management.   Presented with complaints of abdominal pain and vomiting.  Patient also concerned about blood in his stool.  Laboratory tests do not show any signs of anemia.  Leukocytosis noted.  Labs do not show signs of hepatitis or pancreatitis.  No severe dehydration.  Discussed his elevated white blood cell count.  Cannot completely exclude the possibility of colitis or appendicitis but overall low suspicion based on his exam.  He does not have any abdominal tenderness at this time.  Discussed option of doing a CT scan to evaluate further.  Patient states he is feeling better and would rather not to have that done.  Discussed return to the ED for worsening or recurrent symptoms.  Will discharge home with medications for symptomatic relief        Final Clinical Impression(s)  / ED Diagnoses Final diagnoses:  Abdominal pain, unspecified abdominal location    Rx / DC Orders ED Discharge Orders          Ordered    dicyclomine (BENTYL) 20 MG tablet  2 times daily        06/21/23 1041    ondansetron (ZOFRAN-ODT) 8 MG disintegrating tablet  Every 8 hours  PRN        06/21/23 1041              Linwood Dibbles, MD 06/21/23 1043

## 2023-06-21 NOTE — Discharge Instructions (Signed)
Take the medications as needed for abdominal cramping and vomiting.  Please drink plenty of fluids.  Return to the ER if you start having increasing pain fever or other concerns.

## 2023-06-21 NOTE — ED Triage Notes (Signed)
Pt complaining of abd pain that began yesterday. Pt also endorses N/V/D, and noticed both dark and bright red blood present in stool.

## 2023-07-06 DIAGNOSIS — M86141 Other acute osteomyelitis, right hand: Secondary | ICD-10-CM | POA: Diagnosis not present

## 2024-03-09 DIAGNOSIS — S63692A Other sprain of right middle finger, initial encounter: Secondary | ICD-10-CM | POA: Diagnosis not present

## 2024-04-28 ENCOUNTER — Other Ambulatory Visit: Payer: Self-pay | Admitting: Medical Genetics

## 2024-04-28 DIAGNOSIS — Z006 Encounter for examination for normal comparison and control in clinical research program: Secondary | ICD-10-CM

## 2024-07-21 ENCOUNTER — Emergency Department (HOSPITAL_BASED_OUTPATIENT_CLINIC_OR_DEPARTMENT_OTHER)
Admission: EM | Admit: 2024-07-21 | Discharge: 2024-07-21 | Discharge status: Against Medical Advice | Attending: Emergency Medicine | Admitting: Emergency Medicine

## 2024-07-21 ENCOUNTER — Emergency Department (HOSPITAL_BASED_OUTPATIENT_CLINIC_OR_DEPARTMENT_OTHER)

## 2024-07-21 ENCOUNTER — Other Ambulatory Visit: Payer: Self-pay

## 2024-07-21 ENCOUNTER — Encounter (HOSPITAL_BASED_OUTPATIENT_CLINIC_OR_DEPARTMENT_OTHER): Payer: Self-pay

## 2024-07-21 DIAGNOSIS — Z5321 Procedure and treatment not carried out due to patient leaving prior to being seen by health care provider: Secondary | ICD-10-CM | POA: Insufficient documentation

## 2024-07-21 DIAGNOSIS — R519 Headache, unspecified: Secondary | ICD-10-CM | POA: Insufficient documentation

## 2024-07-21 DIAGNOSIS — R5383 Other fatigue: Secondary | ICD-10-CM | POA: Insufficient documentation

## 2024-07-21 DIAGNOSIS — R0602 Shortness of breath: Secondary | ICD-10-CM | POA: Diagnosis not present

## 2024-07-21 LAB — BASIC METABOLIC PANEL WITH GFR
Anion gap: 14 (ref 5–15)
BUN: 12 mg/dL (ref 6–20)
CO2: 25 mmol/L (ref 22–32)
Calcium: 8.8 mg/dL — ABNORMAL LOW (ref 8.9–10.3)
Chloride: 105 mmol/L (ref 98–111)
Creatinine, Ser: 1 mg/dL (ref 0.61–1.24)
GFR, Estimated: >60 mL/min
Glucose, Bld: 89 mg/dL (ref 70–99)
Potassium: 3.8 mmol/L (ref 3.5–5.1)
Sodium: 143 mmol/L (ref 135–145)

## 2024-07-21 LAB — CBC
HCT: 38 % — ABNORMAL LOW (ref 39.0–52.0)
Hemoglobin: 13.2 g/dL (ref 13.0–17.0)
MCH: 32.1 pg (ref 26.0–34.0)
MCHC: 34.7 g/dL (ref 30.0–36.0)
MCV: 92.5 fL (ref 80.0–100.0)
Platelets: 251 K/uL (ref 150–400)
RBC: 4.11 MIL/uL — ABNORMAL LOW (ref 4.22–5.81)
RDW: 13.5 % (ref 11.5–15.5)
WBC: 8.4 K/uL (ref 4.0–10.5)
nRBC: 0 % (ref 0.0–0.2)

## 2024-07-21 LAB — TROPONIN T, HIGH SENSITIVITY: Troponin T High Sensitivity: <15 ng/L (ref 0–19)

## 2024-07-21 NOTE — ED Triage Notes (Signed)
 Pt arrives with c/o HTN that started about 2 weeks ago. Pt reports headache, SOB, CP, and fatigue. BP is normotensive in triage.
